# Patient Record
Sex: Male | Born: 1935 | Race: White | Hispanic: No | Marital: Married | Smoking: Never smoker
Health system: Southern US, Community
[De-identification: ages and names within clinical notes are randomized; demographics above are authoritative.]

## PROBLEM LIST (undated history)

## (undated) DIAGNOSIS — I6523 Occlusion and stenosis of bilateral carotid arteries: Secondary | ICD-10-CM

## (undated) DIAGNOSIS — Z9229 Personal history of other drug therapy: Secondary | ICD-10-CM

## (undated) DIAGNOSIS — E785 Hyperlipidemia, unspecified: Secondary | ICD-10-CM

## (undated) DIAGNOSIS — I2510 Atherosclerotic heart disease of native coronary artery without angina pectoris: Secondary | ICD-10-CM

## (undated) DIAGNOSIS — I10 Essential (primary) hypertension: Secondary | ICD-10-CM

## (undated) DIAGNOSIS — I480 Paroxysmal atrial fibrillation: Secondary | ICD-10-CM

## (undated) DIAGNOSIS — E119 Type 2 diabetes mellitus without complications: Secondary | ICD-10-CM

## (undated) HISTORY — DX: Personal history of other drug therapy: Z92.29

## (undated) HISTORY — DX: Type 2 diabetes mellitus without complications: E11.9

## (undated) HISTORY — DX: Essential (primary) hypertension: I10

## (undated) HISTORY — DX: Hyperlipidemia, unspecified: E78.5

## (undated) HISTORY — PX: TOTAL KNEE ARTHROPLASTY: SHX125

## (undated) HISTORY — DX: Paroxysmal atrial fibrillation: I48.0

## (undated) HISTORY — DX: Occlusion and stenosis of bilateral carotid arteries: I65.23

## (undated) HISTORY — DX: Atherosclerotic heart disease of native coronary artery without angina pectoris: I25.10

## (undated) HISTORY — PX: CORONARY ARTERY BYPASS GRAFT: SHX141

## (undated) HISTORY — PX: CARDIAC CATHETERIZATION: SHX172

---

## 2008-02-22 ENCOUNTER — Inpatient Hospital Stay (HOSPITAL_COMMUNITY): Admission: RE | Admit: 2008-02-22 | Discharge: 2008-02-26 | Payer: Self-pay | Admitting: Orthopedic Surgery

## 2008-08-24 ENCOUNTER — Inpatient Hospital Stay (HOSPITAL_COMMUNITY): Admission: RE | Admit: 2008-08-24 | Discharge: 2008-08-27 | Payer: Self-pay | Admitting: Orthopedic Surgery

## 2008-09-14 IMAGING — CR DG CHEST 1V PORT
1 series · 1 of 1 positions shown · non-contrast
Comparison: 02/14/2008

CLINICAL DATA: Postop knee replacement.  Decreased oxygen
saturation.

PORTABLE CHEST - 1 VIEW

[view not recorded]
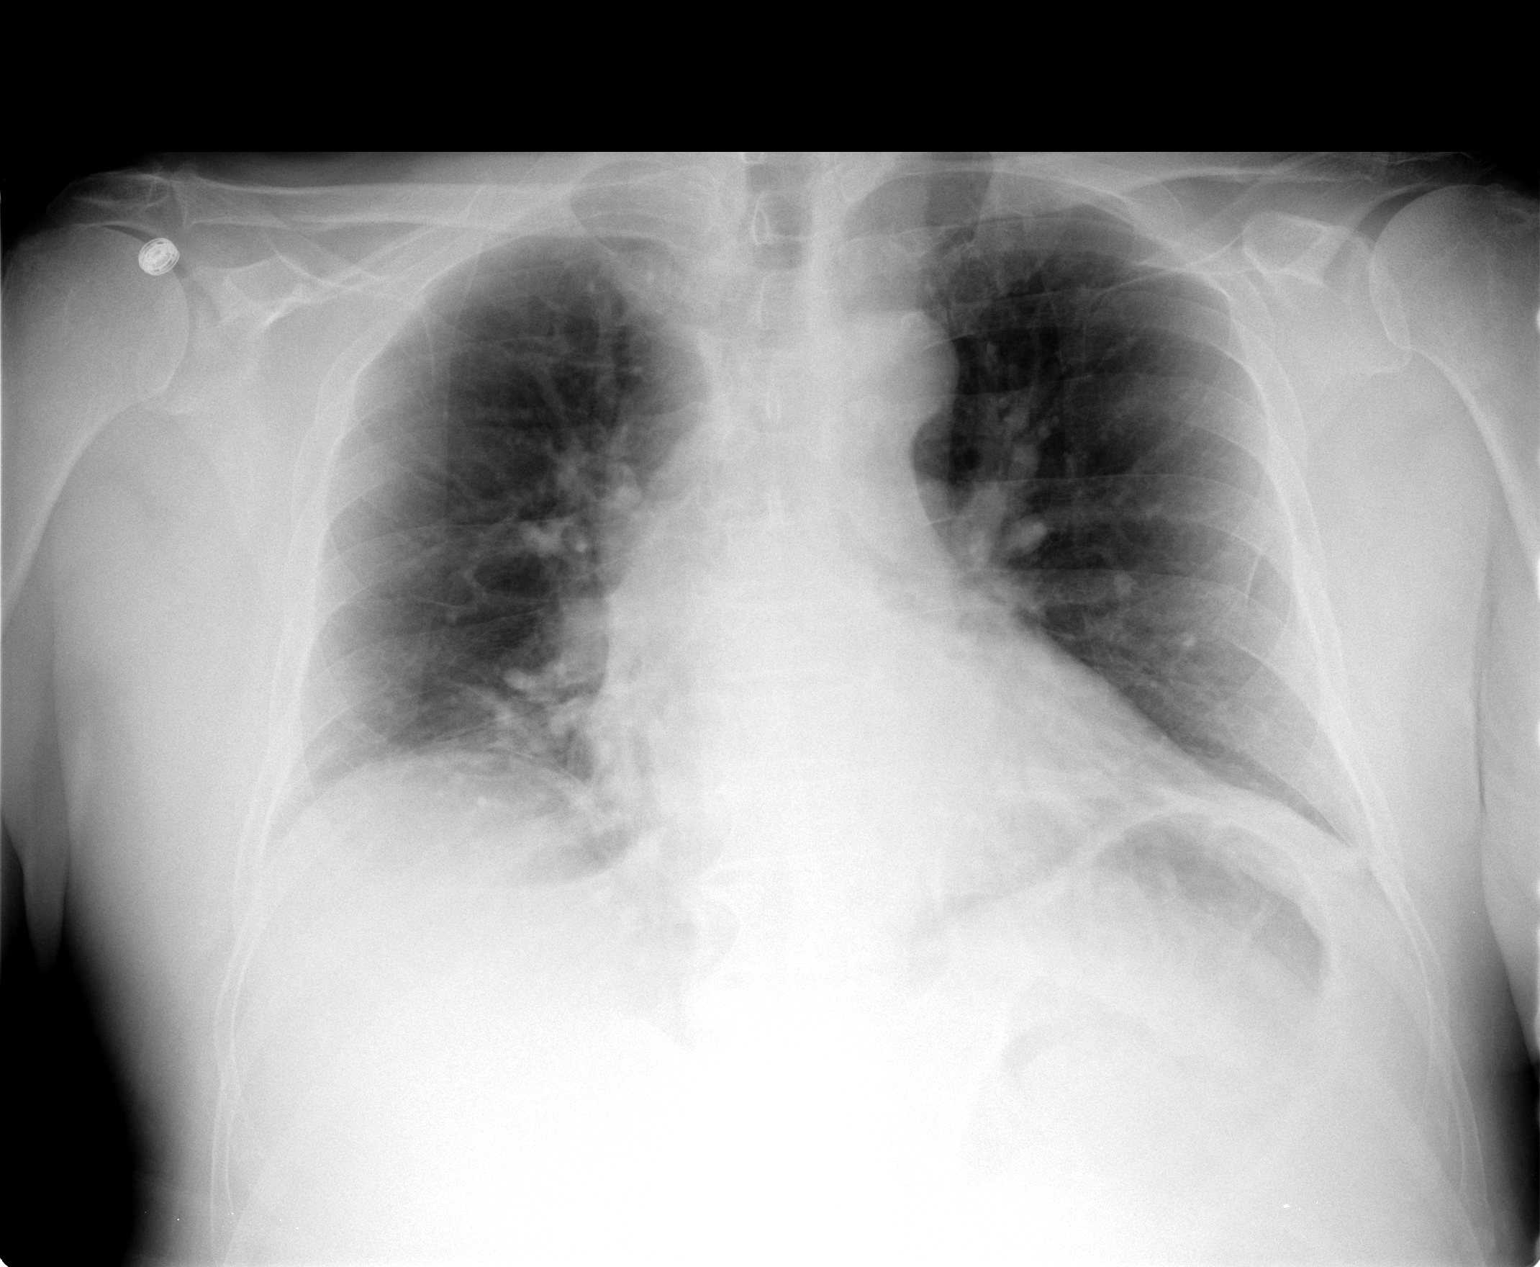

[1 of 1 positions shown; findings below may reference images not displayed]

FINDINGS: There is decreased lung volume with mild bibasilar
atelectasis.  There is no edema or effusion.
IMPRESSION: Hypoventilation with bibasilar atelectasis.

## 2011-02-24 NOTE — Op Note (Signed)
NAMEDENYS, SALINGER               ACCOUNT NO.:  [id number]   MEDICAL RECORD NO.:  [id number]          PATIENT TYPE:  INP   LOCATION:  5038                         FACILITY:  [facility]   PHYSICIAN:  Almedia Balls. Ranell Patrick, M.D. DATE OF BIRTH:  Jan 01, 1935   DATE OF PROCEDURE:  08/24/2008  DATE OF DISCHARGE:                               OPERATIVE REPORT   PREOPERATIVE DIAGNOSIS:  Left knee end-stage osteoarthritis.   POSTOPERATIVE DIAGNOSIS:  Left knee end-stage osteoarthritis.   PROCEDURE PERFORMED:  Left knee total knee replacement using DePuy Sigma  rotating-platform prosthesis.   ATTENDING SURGEON:  Almedia Balls. Ranell Patrick, MD   ASSISTANT:  Donnie Coffin. [place], [facility]   General anesthesia was used plus femoral block.   ESTIMATED BLOOD LOSS:  Minimal.   FLUID REPLACEMENT:  1200 mL of crystalloid.   INSTRUMENT COUNTS:  Correct.   COMPLICATIONS:  None.   Preoperative antibiotics were given.   INDICATIONS:  The patient is a 76 year old male status post right total  knee replacement for end-stage osteoarthritis.  Having done well with  that side, the patient's desire is to proceed with a left total knee  replacement for end-stage arthritis with functional loss and pain.  Informed consent was obtained.   DESCRIPTION OF PROCEDURE:  After an adequate level of anesthesia was  achieved, the patient was positioned in supine on the operating room  table.  The left leg was correctly identified.  Nonsterile tourniquet  placed on the left proximal thigh.  The left leg was sterilely prepped  and draped in the usual manner.  The leg was elevated and exsanguinated  using an Esmarch bandage, and then tourniquet elevated to 300 mmHg.  A  longitudinal midline incision was created with knee in flexion.  Medial  parapatellar arthrotomy was performed.  The patella was everted.  Distal  femur was opened using a step-cut drill.  Distal femoral resection of 10  mm using 5 degrees left.  We then sized the femur to a  size 3, matching  the other side, performed anterior and posterior chamfer cuts using the  4-in-1 block.  We then removed ACL and PCL, medial and lateral menisci,  and subluxed the tibia anteriorly.  Next, we went ahead and used the  extramedullary guide for the tibia and resected 4 mm off the affected  medial side, 90 degrees perpendicular long axis of tibia.  We then used  the extension and flexion block shims, and placed this into the knee to  make sure that we had adequate extension and flexion spaces.  Once that  was confirmed, we went ahead and finished preparation of the tibia,  sizing to size 3, performing our modular  keel punch and drill and then  placing the trial tibia in place.  We then resected the bone for the  posterior cruciate-substituting prosthesis using the box cut guide.  Once that was done, we placed a size 3 left femur in place with a 15  insert.  We are happy with the balance and range of motion.  At this  point, we went ahead and resurfaced the patella  going from a 26-mm  thickness down to a 16-mm thickness and replacing with size 35 patella.  This was done using the patellar clamp resection guide.  Once we  finished patella resurfacing, we went ahead and took the knee through a  full range of motion.  Excellent soft tissue balance and patella  tracking were achieved with no-touch technique.  We then removed all  trial components, thoroughly pulse irrigated the knee, and then cemented  the components in place.  We did bone plug into the femur using  available bone.  At this point, we went ahead and allowed the cement to  harden.  We had a 15 insert in place.  We felt like he can probably  tolerate a 17.5, which we did and this did fit a little bit better.  We  removed all excess cement using quarter inch curved osteotome.  We then  went ahead and closed the knee once we exchanged and placed the 17.5  real inserted in.  We went ahead and closed the knee using #1  Vicryl in  figure-of-eight for the medial parapatellar arthrotomy, we then used 0  and 2-0 Vicryl layer closure for subcutaneous closure, and 4-0 running  Monocryl for skin.  Steri-strips applied, followed by sterile dressing.  The patient tolerated the surgery well.      Almedia Balls. Ranell Patrick, M.D.  Electronically Signed     SRN/MEDQ  D:  08/24/2008  T:  08/25/2008  Job:  [id number]

## 2011-02-24 NOTE — H&P (Signed)
NAME:  Austin Duncan, Austin Duncan               ACCOUNT NO.:  [id number]   MEDICAL RECORD NO.:  [id number]          PATIENT TYPE:  INP   LOCATION:  NA                           FACILITY:  [facility]   PHYSICIAN:  Almedia Balls. Ranell Patrick, M.D. DATE OF BIRTH:  18-Feb-1935   DATE OF ADMISSION:  02/22/2008  DATE OF DISCHARGE:                              HISTORY & PHYSICAL   CHIEF COMPLAINT:  Right knee pain.   HISTORY OF PRESENT ILLNESS:  The patient is a 76 year old male with  worsening right knee pain that has been refractory with conservative  treatment.  The patient elected to have a right total knee arthroplasty  by Dr. Malon Kindle.   PAST MEDICAL HISTORY:  Significant for hypertension, GERD, and  osteoarthritis.   FAMILY MEDICAL HISTORY:  Coronary artery disease.   SOCIAL HISTORY:  Does not smoke or use alcohol.  Patient of Dr. Harrison Mons.   ALLERGIES:  No known drug allergies.   CURRENT MEDICATIONS:  The patient forgot his list so will have to check  those when he comes to the hospital, but he states he did take an  antihypertensive and some pain medicine.   REVIEW OF SYSTEMS:  Pain with ambulation.   PHYSICAL EXAMINATION:  VITAL SIGNS:  Pulse 78, respirations 16, blood  pressure 140/70.  GENERAL:  The patient is a healthy-appearing 76 year old male in no  acute distress.  Pleasant affect, alert and oriented  x3.  HEAD AND NECK:  Shows cranial nerves II-XII grossly intact.  He has full  range of motion of cervical, thoracic, and lumbar spines without any  difficulty and no tenderness.  CHEST:  Active breath sounds bilaterally.  No wheezes, rhonchi or rales.  HEART:  Shows regular rate and rhythm.  No murmur.  ABDOMEN:  Nontender, nondistended.  EXTREMITIES:  He has moderate tenderness to the right knee especially  medial with trace effusion.  He does have a mildly antalgic gait.  Calves nontender.  No pedal edema.  NEUROLOGIC:  He is intact distally.   X-rays show end-stage osteoarthritis to  the right knee.   IMPRESSION:  End-stage osteoarthritis right knee.   PLAN:  Total right total knee arthroplasty by Dr. Malon Kindle.      Thomas B. Durwin Nora, P.A.      Almedia Balls. Ranell Patrick, M.D.  Electronically Signed    TBD/MEDQ  D:  02/15/2008  T:  02/15/2008  Job:  [id number]

## 2011-02-24 NOTE — Op Note (Signed)
NAMEELMOR, KOST NO.:  [id number]   MEDICAL RECORD NO.:  [id number]          PATIENT TYPE:  INP   LOCATION:  0007                         FACILITY:  [facility]   PHYSICIAN:  Almedia Balls. Ranell Patrick, M.D. DATE OF BIRTH:  02-09-1935   DATE OF PROCEDURE:  02/22/2008  DATE OF DISCHARGE:                               OPERATIVE REPORT   PREOPERATIVE DIAGNOSIS:  Right knee end-stage arthritis.   POSTOPERATIVE DIAGNOSIS:  Right knee end-stage arthritis.   PROCEDURE PERFORMED:  Right total knee placement.   SURGEON:  Almedia Balls. Ranell Patrick, M.D.   ASSISTANT:  [place] Charon, [place].   ANESTHESIA:  General anesthesia plus femoral block anesthesia was used.   ESTIMATED BLOOD LOSS:  Minimal.   TOURNIQUET TIME:  1 hour and 10 minutes at 300 mmHg.   INSTRUMENT COUNTS:  Correct.   COMPLICATIONS:  None.   MEDICATIONS:  Perioperative antibiotics were given.   INDICATIONS:  The patient is a 76 year old male with worsening right  knee arthritis.  The patient has failed an extensive period of  conservative management and desires operative treatment to restore  function and eliminate pain.  Informed consent was obtained.   DESCRIPTION OF PROCEDURE:  After an adequate level of anesthesia was  achieved, the patient was positioned supine on the operating table.  Nonsterile tourniquet was placed on the right proximal thigh.  Right leg  was sterilely and draped in the usual manner.  After exsanguination of  the limb using an Esmarch bandage, we elevated the tourniquet to 300  mmHg.  A longitudinal midline incision was created with the knee in  flexion.  Medial parapatellar arthrotomy was created using a fresh  knife.  The patella was everted.  Lateral patellofemoral ligaments  divided.  The distal femur was entered using the cut drill.  We then  irrigated the distal femur and instrumented the distal femur with the  distal femoral resection guide intramedullary 5 degrees right, 10 mm  resection.  We then sized the femur to size 3, sized perfectly.  We then  performed anterior, posterior and chamfer block cuts.  Removed PCL, ACL  and remaining meniscal tissue.  Subluxed the tibia anteriorly and cut  the tibia 4 mm off the affected side, 90 degrees perpendicular long axis  of the tibia with minimal posterior slope.  We next went ahead and  checked our flexion/extension gaps.  Our gaps were symmetric.  At this  point we went and removed pins from the tibia.  We then continued  resurfacing the distal femur performing our box cut and removing excess  posterior bone off the posterior femoral condyles.  We then sized the  tibia to size 3, performed our modular drill and keel punch preparation  of the tibia.  We then trialed with the 15 and the 7.5 trial inserts.  The 17.5 gave [place] excellent stability and full extension.  We selected  that.  Then we went ahead and resurfaced the patella going from a 25  down to a 15 mm thickness, placing 38 patella button on.  Took the knee  through a  full range of motion.  Excellent soft tissue balancing, normal  patellar tracking noted.  We removed all trial components.  We plugged  the distal femur with available bone.  We then cemented the components  into place after thorough irrigation using DePuy Smart set cement.  Once  the cement was fully hardened having placed a tibial insert in and  holding the knee in extension after the cement was fully hardened we  removed excess cement with 4 inch curved osteotome.  We inspected the  remainder of the knee including posteriorly for any excess cement.  We  trialed again with the 17.5 insert and were happy with that.  We  selected the real 17.5 poly insert, inserting that, ranging the knee and  noting good stability.  We then closed the knee with interrupted #1 PDS  suture in figure-of-eight fashion followed by 0 and 2-0 Vicryl  subcutaneous closure and 4-0 Monocryl for skin.  Steri-Strips applied   followed by sterile dressing.  The patient tolerated the surgery well.      Almedia Balls. Ranell Patrick, M.D.  Electronically Signed     SRN/MEDQ  D:  02/22/2008  T:  02/22/2008  Job:  [id number]

## 2011-02-27 NOTE — H&P (Signed)
NAMESILUS, LANZO               ACCOUNT NO.:  [id number]   MEDICAL RECORD NO.:  [id number]          PATIENT TYPE:  INP   LOCATION:  NA                           FACILITY:  [facility]   PHYSICIAN:  Almedia Balls. Ranell Patrick, M.D. DATE OF BIRTH:  1934/11/28   DATE OF ADMISSION:  DATE OF DISCHARGE:                              HISTORY & PHYSICAL   CHIEF COMPLAINTS:  Left knee pain.   HISTORY OF PRESENT ILLNESS:  The patient is a 76 year old male with  worsening left knee pain secondary to osteoarthritis.  This has been  refractory to conservative treatment.  The patient elected to have a  left total knee arthroplasty by Dr. Malon Kindle.   PAST MEDICAL HISTORY:  1. Hypertension.  2. GERD.  3. Rheumatoid arthritis.   FAMILY HISTORY:  History of coronary artery disease and cancer.   SOCIAL HISTORY:  The patient is married.  The patient of Dr. Harrison Mons.  Does not smoke but use alcohol.   DRUG ALLERGIES:  None.   CURRENT MEDICATIONS:  1. Lisinopril 20 mg p.o. daily.  2. Omeprazole 20 mg p.o. daily.   REVIEW OF SYSTEMS:  Pain with ambulation only.   PHYSICAL EXAMINATION:  Pulse 80, respirations 61, and blood pressure  123/78.  The patient is a healthy-appearing 76 year old male in no acute  distress, pleasant mood and affect, alert and oriented x3.  Head and  neck exam shows cranial nerves II through XII grossly intact.  He has  full range of motion without any tenderness.  Chest has active breath  sounds bilaterally.  No wheezes, rhonchi, or rales.  Heart shows regular  rate and rhythm with a 2/6 systolic murmur.  Abdomen is nontender and  nondistended.  Extremities shows moderate tenderness at the left knee,  especially at the medial joint line.  Skin showed no rashes or edema.  X-  rays showed end-stage osteoarthritis, left knee.   IMPRESSION:  End-stage osteoarthritis, left knee.   PLAN OF ACTION:  He is to have a left total knee arthroplasty by Dr.  Malon Kindle.      Thomas B.  Durwin Nora, P.A.      Almedia Balls. Ranell Patrick, M.D.  Electronically Signed    TBD/MEDQ  D:  08/08/2008  T:  08/09/2008  Job:  [id number]

## 2011-02-27 NOTE — Discharge Summary (Signed)
Austin Duncan, Austin Duncan               ACCOUNT NO.:  [id number]   MEDICAL RECORD NO.:  [id number]          PATIENT TYPE:  INP   LOCATION:  5038                         FACILITY:  [facility]   PHYSICIAN:  Almedia Balls. Ranell Patrick, M.D. DATE OF BIRTH:  [birth year]   DATE OF ADMISSION:  08/24/2008  DATE OF DISCHARGE:  08/27/2008                               DISCHARGE SUMMARY   ADMITTING DIAGNOSIS:  Left knee arthritis, end-stage.   DISCHARGE DIAGNOSIS:  Left knee arthritis, end-stage.   PROCEDURE PERFORMED:  Left total knee replacement performed on August 24, 2008, by Dr. Ranell Patrick.   CONSULTING SERVICES:  Physical therapy, occupational therapy, discharge  planning, and pharmacy.   HISTORY OF PRESENT ILLNESS:  The patient is a 76 year old male with  disabling left knee pain.  The patient has failed conservative  management and desires operative treatment to restore function and  eliminate pain.  For further details on the patient's past medical  history and physical examination, please see the medical record.   HOSPITAL COURSE:  The patient was admitted to orthopedics August 24, 2008, where he underwent successful total knee arthroplasty.  He was  immediately started on DVT prophylaxis including mechanical devices  including TED hose and PlexiPulse foot pumps as well as Coumadin.  The  patient was up on physical therapy day 1.  CPM was instituted.  The  patient remained afebrile, tolerating regular diet, and stable  postoperative course.  He was discharged on August 27, 2008, to home  with home health physical therapy, occupational therapy, and nursing.  He will be on Coumadin for 30 days.  INR 2.5-3.0 per pharmacy protocol.  Discharged on Percocet and also on Robaxin.  We will be seeing him in  the office in 2 weeks and he is weightbearing as tolerated.       Almedia Balls. Ranell Patrick, M.D.  Electronically Signed     SRN/MEDQ  D:  10/10/2008  T:  10/11/2008  Job:  [id number]

## 2011-02-27 NOTE — Discharge Summary (Signed)
Austin Duncan, KUCHER               ACCOUNT NO.:  [id number]   MEDICAL RECORD NO.:  [id number]          PATIENT TYPE:  INP   LOCATION:  1610                         [facility]ACILITY:  [facility]   PHYSICIAN:  Almedia Balls. Ranell Patrick, M.D. DATE OF BIRTH:  1935-08-21   DATE OF ADMISSION:  02/22/2008  DATE OF DISCHARGE:  02/26/2008                               DISCHARGE SUMMARY   ADMISSION DIAGNOSIS:  Right knee end-stage osteoarthritis.   DISCHARGE DIAGNOSIS:  Right knee end-stage osteoarthritis.   BRIEF HISTORY:  The patient is a 76 year old male with worsening right  knee pain that has been refractory to conservative treatment.  Patient  has elected to have total knee arthroplasty.   PROCEDURE:  The patient had a right total knee arthroplasty by Dr.  Malon Kindle on Feb 22, 2008.  Assistant was [employer], PA-C.  General anesthesia was used with a femoral block.  No complications and  minimal blood loss.   HOSPITAL COURSE:  The patient admitted on Feb 22, 2008, for the above-  stated procedure, which he tolerated well.  After adequate time in the  post anesthesia care unit, he was transferred up to [address].  Postop day  #1 the patient complained of some moderate pain to the knee but  otherwise was comfortable.  His dressing was dry and intact.  He was  able to work with physical therapy over the next several days and  actually did quite well, enough to be eligible for discharge home on the  17th.  His INR was slowly rising to 1.6 and 1.8 on the day of discharge.  Otherwise, his wound was healing well and the patient was afebrile.   DISCHARGE/PLAN:  The patient will be discharged home on Feb 26, 2008.   CONDITION:  Stable.   DIET:  Regular as tolerated.   The patient will follow back up with Dr. Malon Kindle in 2 weeks.   DISCHARGE MEDICATIONS:  1. Percocet 5/325 mg one to two tabs q.4-6 h. p.r.n. pain.  2. Robaxin 500 mg p.o. q.6 h.  3. Coumadin per pharmacy protocol.      Thomas B.  Durwin Nora, P.A.      Almedia Balls. Ranell Patrick, M.D.  Electronically Signed    TBD/MEDQ  D:  04/16/2008  T:  04/16/2008  Job:  [id number]

## 2011-07-08 LAB — BASIC METABOLIC PANEL
BUN: 6
BUN: 8
CO2: 26
Calcium: 8.4
Chloride: 106
GFR calc Af Amer: >60
GFR calc non Af Amer: >60
GFR calc non Af Amer: >60
Glucose, Bld: 157 — ABNORMAL HIGH
Potassium: 3.6
Sodium: 135

## 2011-07-08 LAB — PROTIME-INR
INR: 1.8 — ABNORMAL HIGH
Prothrombin Time: 18.3 — ABNORMAL HIGH
Prothrombin Time: 19.6 — ABNORMAL HIGH
Prothrombin Time: 21.1 — ABNORMAL HIGH

## 2011-07-08 LAB — CBC
HCT: 34.5 — ABNORMAL LOW
Hemoglobin: 11.8 — ABNORMAL LOW
Hemoglobin: 12.4 — ABNORMAL LOW
MCHC: 34.2
MCHC: 34.4
MCV: 87.7
Platelets: 118 — ABNORMAL LOW
Platelets: 119 — ABNORMAL LOW
RBC: 3.93 — ABNORMAL LOW
RDW: 13.3
WBC: 6.9

## 2011-07-14 LAB — DIFFERENTIAL
Basophils Absolute: 0
Basophils Relative: 0
Lymphocytes Relative: 29
Monocytes Relative: 7
Neutrophils Relative %: 62

## 2011-07-14 LAB — CBC
Hemoglobin: 11.8 — ABNORMAL LOW
Hemoglobin: 12.6 — ABNORMAL LOW
Hemoglobin: 14.8
MCHC: 33.7
MCHC: 34.2
MCHC: 34.4
MCHC: 34.4
MCV: 88.3
MCV: 89.3
MCV: 89.6
Platelets: 135 — ABNORMAL LOW
RBC: 3.63 — ABNORMAL LOW
RBC: 3.83 — ABNORMAL LOW
RDW: 13
RDW: 13.7
WBC: 5.3
WBC: 8.7

## 2011-07-14 LAB — PROTIME-INR
INR: 1.4
INR: 1.9 — ABNORMAL HIGH
Prothrombin Time: 13.7
Prothrombin Time: 15.2
Prothrombin Time: 22.5 — ABNORMAL HIGH

## 2011-07-14 LAB — BASIC METABOLIC PANEL
BUN: 11
CO2: 27
CO2: 27
CO2: 28
Calcium: 8.6
Calcium: 8.8
Chloride: 101
Chloride: 99
Creatinine, Ser: 0.81
Creatinine, Ser: 0.95
GFR calc Af Amer: >60
GFR calc Af Amer: >60
GFR calc Af Amer: >60
GFR calc Af Amer: >60
GFR calc non Af Amer: >60
Potassium: 3.8
Sodium: 134 — ABNORMAL LOW
Sodium: 135
Sodium: 138

## 2011-07-14 LAB — GLUCOSE, CAPILLARY
Glucose-Capillary: 116 — ABNORMAL HIGH
Glucose-Capillary: 133 — ABNORMAL HIGH
Glucose-Capillary: 151 — ABNORMAL HIGH
Glucose-Capillary: 167 — ABNORMAL HIGH
Glucose-Capillary: 196 — ABNORMAL HIGH
Glucose-Capillary: 196 — ABNORMAL HIGH
Glucose-Capillary: 203 — ABNORMAL HIGH
Glucose-Capillary: 208 — ABNORMAL HIGH

## 2011-07-14 LAB — ABO/RH: ABO/RH(D): A NEG

## 2011-07-14 LAB — TYPE AND SCREEN: Antibody Screen: NEGATIVE

## 2011-07-14 LAB — URINALYSIS, ROUTINE W REFLEX MICROSCOPIC
Bilirubin Urine: NEGATIVE
Nitrite: NEGATIVE
Urobilinogen, UA: 0.2
pH: 5.5

## 2014-11-29 DIAGNOSIS — E1142 Type 2 diabetes mellitus with diabetic polyneuropathy: Secondary | ICD-10-CM | POA: Diagnosis not present

## 2014-11-29 DIAGNOSIS — I10 Essential (primary) hypertension: Secondary | ICD-10-CM | POA: Diagnosis not present

## 2014-11-29 DIAGNOSIS — Z79899 Other long term (current) drug therapy: Secondary | ICD-10-CM | POA: Diagnosis not present

## 2014-11-29 DIAGNOSIS — I2510 Atherosclerotic heart disease of native coronary artery without angina pectoris: Secondary | ICD-10-CM | POA: Diagnosis not present

## 2014-11-29 DIAGNOSIS — E785 Hyperlipidemia, unspecified: Secondary | ICD-10-CM | POA: Diagnosis not present

## 2015-06-12 DIAGNOSIS — E785 Hyperlipidemia, unspecified: Secondary | ICD-10-CM | POA: Diagnosis not present

## 2015-06-12 DIAGNOSIS — Z79899 Other long term (current) drug therapy: Secondary | ICD-10-CM | POA: Diagnosis not present

## 2015-06-12 DIAGNOSIS — I2510 Atherosclerotic heart disease of native coronary artery without angina pectoris: Secondary | ICD-10-CM | POA: Diagnosis not present

## 2015-06-12 DIAGNOSIS — E1142 Type 2 diabetes mellitus with diabetic polyneuropathy: Secondary | ICD-10-CM | POA: Diagnosis not present

## 2015-06-12 DIAGNOSIS — E663 Overweight: Secondary | ICD-10-CM | POA: Diagnosis not present

## 2015-06-12 DIAGNOSIS — Z6829 Body mass index (BMI) 29.0-29.9, adult: Secondary | ICD-10-CM | POA: Diagnosis not present

## 2015-06-12 DIAGNOSIS — Z23 Encounter for immunization: Secondary | ICD-10-CM | POA: Diagnosis not present

## 2015-06-12 DIAGNOSIS — I10 Essential (primary) hypertension: Secondary | ICD-10-CM | POA: Diagnosis not present

## 2015-10-10 DIAGNOSIS — E119 Type 2 diabetes mellitus without complications: Secondary | ICD-10-CM | POA: Diagnosis not present

## 2015-11-11 DIAGNOSIS — I10 Essential (primary) hypertension: Secondary | ICD-10-CM | POA: Diagnosis not present

## 2015-11-11 DIAGNOSIS — Z6829 Body mass index (BMI) 29.0-29.9, adult: Secondary | ICD-10-CM | POA: Diagnosis not present

## 2015-11-12 DIAGNOSIS — I10 Essential (primary) hypertension: Secondary | ICD-10-CM | POA: Diagnosis not present

## 2016-02-17 DIAGNOSIS — E785 Hyperlipidemia, unspecified: Secondary | ICD-10-CM | POA: Diagnosis not present

## 2016-02-17 DIAGNOSIS — Z9181 History of falling: Secondary | ICD-10-CM | POA: Diagnosis not present

## 2016-02-17 DIAGNOSIS — E119 Type 2 diabetes mellitus without complications: Secondary | ICD-10-CM | POA: Diagnosis not present

## 2016-02-17 DIAGNOSIS — M1990 Unspecified osteoarthritis, unspecified site: Secondary | ICD-10-CM | POA: Diagnosis not present

## 2016-02-17 DIAGNOSIS — Z6828 Body mass index (BMI) 28.0-28.9, adult: Secondary | ICD-10-CM | POA: Diagnosis not present

## 2016-02-17 DIAGNOSIS — Z1389 Encounter for screening for other disorder: Secondary | ICD-10-CM | POA: Diagnosis not present

## 2016-02-17 DIAGNOSIS — I10 Essential (primary) hypertension: Secondary | ICD-10-CM | POA: Diagnosis not present

## 2016-02-17 DIAGNOSIS — K219 Gastro-esophageal reflux disease without esophagitis: Secondary | ICD-10-CM | POA: Diagnosis not present

## 2016-06-13 DIAGNOSIS — Z23 Encounter for immunization: Secondary | ICD-10-CM | POA: Diagnosis not present

## 2016-08-25 DIAGNOSIS — K219 Gastro-esophageal reflux disease without esophagitis: Secondary | ICD-10-CM | POA: Diagnosis not present

## 2016-08-25 DIAGNOSIS — E785 Hyperlipidemia, unspecified: Secondary | ICD-10-CM | POA: Diagnosis not present

## 2016-08-25 DIAGNOSIS — E119 Type 2 diabetes mellitus without complications: Secondary | ICD-10-CM | POA: Diagnosis not present

## 2016-08-25 DIAGNOSIS — Z6828 Body mass index (BMI) 28.0-28.9, adult: Secondary | ICD-10-CM | POA: Diagnosis not present

## 2016-08-25 DIAGNOSIS — I10 Essential (primary) hypertension: Secondary | ICD-10-CM | POA: Diagnosis not present

## 2016-10-23 DIAGNOSIS — Z961 Presence of intraocular lens: Secondary | ICD-10-CM | POA: Diagnosis not present

## 2016-10-23 DIAGNOSIS — H1851 Endothelial corneal dystrophy: Secondary | ICD-10-CM | POA: Diagnosis not present

## 2016-10-23 DIAGNOSIS — E119 Type 2 diabetes mellitus without complications: Secondary | ICD-10-CM | POA: Diagnosis not present

## 2016-11-12 DIAGNOSIS — J111 Influenza due to unidentified influenza virus with other respiratory manifestations: Secondary | ICD-10-CM | POA: Diagnosis not present

## 2016-11-12 DIAGNOSIS — R6889 Other general symptoms and signs: Secondary | ICD-10-CM | POA: Diagnosis not present

## 2016-11-12 DIAGNOSIS — Z6829 Body mass index (BMI) 29.0-29.9, adult: Secondary | ICD-10-CM | POA: Diagnosis not present

## 2016-12-06 DIAGNOSIS — N433 Hydrocele, unspecified: Secondary | ICD-10-CM | POA: Diagnosis not present

## 2016-12-06 DIAGNOSIS — N390 Urinary tract infection, site not specified: Secondary | ICD-10-CM | POA: Diagnosis not present

## 2016-12-15 DIAGNOSIS — N5082 Scrotal pain: Secondary | ICD-10-CM | POA: Diagnosis not present

## 2016-12-15 DIAGNOSIS — N433 Hydrocele, unspecified: Secondary | ICD-10-CM | POA: Diagnosis not present

## 2017-02-22 DIAGNOSIS — Z125 Encounter for screening for malignant neoplasm of prostate: Secondary | ICD-10-CM | POA: Diagnosis not present

## 2017-02-22 DIAGNOSIS — M1990 Unspecified osteoarthritis, unspecified site: Secondary | ICD-10-CM | POA: Diagnosis not present

## 2017-02-22 DIAGNOSIS — I10 Essential (primary) hypertension: Secondary | ICD-10-CM | POA: Diagnosis not present

## 2017-02-22 DIAGNOSIS — E119 Type 2 diabetes mellitus without complications: Secondary | ICD-10-CM | POA: Diagnosis not present

## 2017-02-22 DIAGNOSIS — K219 Gastro-esophageal reflux disease without esophagitis: Secondary | ICD-10-CM | POA: Diagnosis not present

## 2017-02-22 DIAGNOSIS — E785 Hyperlipidemia, unspecified: Secondary | ICD-10-CM | POA: Diagnosis not present

## 2017-03-02 DIAGNOSIS — Z1212 Encounter for screening for malignant neoplasm of rectum: Secondary | ICD-10-CM | POA: Diagnosis not present

## 2017-06-30 DIAGNOSIS — Z23 Encounter for immunization: Secondary | ICD-10-CM | POA: Diagnosis not present

## 2017-07-05 DIAGNOSIS — L814 Other melanin hyperpigmentation: Secondary | ICD-10-CM | POA: Diagnosis not present

## 2017-07-05 DIAGNOSIS — C44622 Squamous cell carcinoma of skin of right upper limb, including shoulder: Secondary | ICD-10-CM | POA: Diagnosis not present

## 2017-07-05 DIAGNOSIS — L570 Actinic keratosis: Secondary | ICD-10-CM | POA: Diagnosis not present

## 2017-07-05 DIAGNOSIS — C44629 Squamous cell carcinoma of skin of left upper limb, including shoulder: Secondary | ICD-10-CM | POA: Diagnosis not present

## 2017-08-10 DIAGNOSIS — C44629 Squamous cell carcinoma of skin of left upper limb, including shoulder: Secondary | ICD-10-CM | POA: Diagnosis not present

## 2017-08-17 DIAGNOSIS — C44622 Squamous cell carcinoma of skin of right upper limb, including shoulder: Secondary | ICD-10-CM | POA: Diagnosis not present

## 2017-08-30 DIAGNOSIS — Z6828 Body mass index (BMI) 28.0-28.9, adult: Secondary | ICD-10-CM | POA: Diagnosis not present

## 2017-08-30 DIAGNOSIS — I10 Essential (primary) hypertension: Secondary | ICD-10-CM | POA: Diagnosis not present

## 2017-08-30 DIAGNOSIS — E785 Hyperlipidemia, unspecified: Secondary | ICD-10-CM | POA: Diagnosis not present

## 2017-08-30 DIAGNOSIS — K219 Gastro-esophageal reflux disease without esophagitis: Secondary | ICD-10-CM | POA: Diagnosis not present

## 2017-08-30 DIAGNOSIS — E119 Type 2 diabetes mellitus without complications: Secondary | ICD-10-CM | POA: Diagnosis not present

## 2017-08-30 DIAGNOSIS — R0609 Other forms of dyspnea: Secondary | ICD-10-CM | POA: Diagnosis not present

## 2017-08-31 DIAGNOSIS — R0609 Other forms of dyspnea: Secondary | ICD-10-CM | POA: Diagnosis not present

## 2017-08-31 DIAGNOSIS — R0602 Shortness of breath: Secondary | ICD-10-CM | POA: Diagnosis not present

## 2017-09-08 DIAGNOSIS — R079 Chest pain, unspecified: Secondary | ICD-10-CM | POA: Diagnosis not present

## 2017-09-08 DIAGNOSIS — R0602 Shortness of breath: Secondary | ICD-10-CM | POA: Insufficient documentation

## 2017-09-08 DIAGNOSIS — I10 Essential (primary) hypertension: Secondary | ICD-10-CM | POA: Diagnosis not present

## 2017-09-08 HISTORY — DX: Shortness of breath: R06.02

## 2017-09-08 HISTORY — DX: Chest pain, unspecified: R07.9

## 2017-09-15 DIAGNOSIS — I700 Atherosclerosis of aorta: Secondary | ICD-10-CM | POA: Diagnosis not present

## 2017-09-15 DIAGNOSIS — R918 Other nonspecific abnormal finding of lung field: Secondary | ICD-10-CM | POA: Diagnosis not present

## 2017-09-16 DIAGNOSIS — I25708 Atherosclerosis of coronary artery bypass graft(s), unspecified, with other forms of angina pectoris: Secondary | ICD-10-CM | POA: Diagnosis not present

## 2017-09-16 DIAGNOSIS — Z794 Long term (current) use of insulin: Secondary | ICD-10-CM | POA: Diagnosis not present

## 2017-09-16 DIAGNOSIS — D649 Anemia, unspecified: Secondary | ICD-10-CM | POA: Diagnosis not present

## 2017-09-16 DIAGNOSIS — I472 Ventricular tachycardia: Secondary | ICD-10-CM | POA: Diagnosis not present

## 2017-09-16 DIAGNOSIS — T8131XA Disruption of external operation (surgical) wound, not elsewhere classified, initial encounter: Secondary | ICD-10-CM | POA: Diagnosis not present

## 2017-09-16 DIAGNOSIS — I25119 Atherosclerotic heart disease of native coronary artery with unspecified angina pectoris: Secondary | ICD-10-CM | POA: Diagnosis not present

## 2017-09-16 DIAGNOSIS — Z7982 Long term (current) use of aspirin: Secondary | ICD-10-CM | POA: Diagnosis not present

## 2017-09-16 DIAGNOSIS — E7849 Other hyperlipidemia: Secondary | ICD-10-CM | POA: Diagnosis not present

## 2017-09-16 DIAGNOSIS — I700 Atherosclerosis of aorta: Secondary | ICD-10-CM | POA: Diagnosis not present

## 2017-09-16 DIAGNOSIS — I480 Paroxysmal atrial fibrillation: Secondary | ICD-10-CM | POA: Diagnosis not present

## 2017-09-16 DIAGNOSIS — J90 Pleural effusion, not elsewhere classified: Secondary | ICD-10-CM | POA: Diagnosis not present

## 2017-09-16 DIAGNOSIS — I2510 Atherosclerotic heart disease of native coronary artery without angina pectoris: Secondary | ICD-10-CM | POA: Diagnosis not present

## 2017-09-16 DIAGNOSIS — R918 Other nonspecific abnormal finding of lung field: Secondary | ICD-10-CM | POA: Diagnosis not present

## 2017-09-16 DIAGNOSIS — I25810 Atherosclerosis of coronary artery bypass graft(s) without angina pectoris: Secondary | ICD-10-CM | POA: Diagnosis not present

## 2017-09-16 DIAGNOSIS — I97190 Other postprocedural cardiac functional disturbances following cardiac surgery: Secondary | ICD-10-CM | POA: Diagnosis not present

## 2017-09-16 DIAGNOSIS — E119 Type 2 diabetes mellitus without complications: Secondary | ICD-10-CM | POA: Diagnosis not present

## 2017-09-16 DIAGNOSIS — R5381 Other malaise: Secondary | ICD-10-CM | POA: Diagnosis not present

## 2017-09-16 DIAGNOSIS — Z951 Presence of aortocoronary bypass graft: Secondary | ICD-10-CM | POA: Diagnosis not present

## 2017-09-16 DIAGNOSIS — I10 Essential (primary) hypertension: Secondary | ICD-10-CM | POA: Diagnosis not present

## 2017-09-16 DIAGNOSIS — D62 Acute posthemorrhagic anemia: Secondary | ICD-10-CM | POA: Diagnosis not present

## 2017-09-16 DIAGNOSIS — E785 Hyperlipidemia, unspecified: Secondary | ICD-10-CM | POA: Diagnosis not present

## 2017-09-16 DIAGNOSIS — R0989 Other specified symptoms and signs involving the circulatory and respiratory systems: Secondary | ICD-10-CM | POA: Diagnosis not present

## 2017-09-16 DIAGNOSIS — Z79899 Other long term (current) drug therapy: Secondary | ICD-10-CM | POA: Diagnosis not present

## 2017-09-24 DIAGNOSIS — R845 Abnormal microbiological findings in specimens from respiratory organs and thorax: Secondary | ICD-10-CM | POA: Diagnosis not present

## 2017-09-24 DIAGNOSIS — I97190 Other postprocedural cardiac functional disturbances following cardiac surgery: Secondary | ICD-10-CM | POA: Diagnosis not present

## 2017-09-24 DIAGNOSIS — R197 Diarrhea, unspecified: Secondary | ICD-10-CM | POA: Diagnosis not present

## 2017-09-24 DIAGNOSIS — Z79899 Other long term (current) drug therapy: Secondary | ICD-10-CM | POA: Diagnosis not present

## 2017-09-24 DIAGNOSIS — Z7409 Other reduced mobility: Secondary | ICD-10-CM | POA: Diagnosis present

## 2017-09-24 DIAGNOSIS — Z951 Presence of aortocoronary bypass graft: Secondary | ICD-10-CM | POA: Diagnosis not present

## 2017-09-24 DIAGNOSIS — R21 Rash and other nonspecific skin eruption: Secondary | ICD-10-CM | POA: Diagnosis not present

## 2017-09-24 DIAGNOSIS — J90 Pleural effusion, not elsewhere classified: Secondary | ICD-10-CM | POA: Diagnosis not present

## 2017-09-24 DIAGNOSIS — E119 Type 2 diabetes mellitus without complications: Secondary | ICD-10-CM | POA: Diagnosis not present

## 2017-09-24 DIAGNOSIS — I2510 Atherosclerotic heart disease of native coronary artery without angina pectoris: Secondary | ICD-10-CM | POA: Diagnosis present

## 2017-09-24 DIAGNOSIS — R5381 Other malaise: Secondary | ICD-10-CM | POA: Diagnosis not present

## 2017-09-24 DIAGNOSIS — R05 Cough: Secondary | ICD-10-CM | POA: Diagnosis not present

## 2017-09-24 DIAGNOSIS — R600 Localized edema: Secondary | ICD-10-CM | POA: Diagnosis present

## 2017-09-24 DIAGNOSIS — E785 Hyperlipidemia, unspecified: Secondary | ICD-10-CM | POA: Diagnosis present

## 2017-09-24 DIAGNOSIS — I472 Ventricular tachycardia: Secondary | ICD-10-CM | POA: Diagnosis present

## 2017-09-24 DIAGNOSIS — J189 Pneumonia, unspecified organism: Secondary | ICD-10-CM | POA: Diagnosis present

## 2017-09-24 DIAGNOSIS — I471 Supraventricular tachycardia: Secondary | ICD-10-CM | POA: Diagnosis not present

## 2017-09-24 DIAGNOSIS — I10 Essential (primary) hypertension: Secondary | ICD-10-CM | POA: Diagnosis not present

## 2017-09-24 DIAGNOSIS — Z48812 Encounter for surgical aftercare following surgery on the circulatory system: Secondary | ICD-10-CM | POA: Diagnosis not present

## 2017-09-24 DIAGNOSIS — I4891 Unspecified atrial fibrillation: Secondary | ICD-10-CM | POA: Diagnosis not present

## 2017-09-30 DIAGNOSIS — J90 Pleural effusion, not elsewhere classified: Secondary | ICD-10-CM | POA: Diagnosis not present

## 2017-10-13 DIAGNOSIS — Z09 Encounter for follow-up examination after completed treatment for conditions other than malignant neoplasm: Secondary | ICD-10-CM | POA: Diagnosis not present

## 2017-10-15 DIAGNOSIS — E782 Mixed hyperlipidemia: Secondary | ICD-10-CM | POA: Diagnosis not present

## 2017-10-15 DIAGNOSIS — Z951 Presence of aortocoronary bypass graft: Secondary | ICD-10-CM

## 2017-10-15 DIAGNOSIS — I480 Paroxysmal atrial fibrillation: Secondary | ICD-10-CM | POA: Diagnosis not present

## 2017-10-15 DIAGNOSIS — I10 Essential (primary) hypertension: Secondary | ICD-10-CM | POA: Diagnosis not present

## 2017-10-15 DIAGNOSIS — I2510 Atherosclerotic heart disease of native coronary artery without angina pectoris: Secondary | ICD-10-CM | POA: Diagnosis not present

## 2017-10-15 HISTORY — DX: Presence of aortocoronary bypass graft: Z95.1

## 2017-10-18 DIAGNOSIS — E785 Hyperlipidemia, unspecified: Secondary | ICD-10-CM | POA: Diagnosis not present

## 2017-11-01 DIAGNOSIS — E785 Hyperlipidemia, unspecified: Secondary | ICD-10-CM | POA: Diagnosis not present

## 2017-11-01 DIAGNOSIS — Z9181 History of falling: Secondary | ICD-10-CM | POA: Diagnosis not present

## 2017-11-01 DIAGNOSIS — Z1331 Encounter for screening for depression: Secondary | ICD-10-CM | POA: Diagnosis not present

## 2017-11-01 DIAGNOSIS — Z23 Encounter for immunization: Secondary | ICD-10-CM | POA: Diagnosis not present

## 2017-11-01 DIAGNOSIS — Z125 Encounter for screening for malignant neoplasm of prostate: Secondary | ICD-10-CM | POA: Diagnosis not present

## 2017-11-01 DIAGNOSIS — Z0000 Encounter for general adult medical examination without abnormal findings: Secondary | ICD-10-CM | POA: Diagnosis not present

## 2017-11-12 DIAGNOSIS — E782 Mixed hyperlipidemia: Secondary | ICD-10-CM | POA: Diagnosis not present

## 2017-11-12 DIAGNOSIS — I480 Paroxysmal atrial fibrillation: Secondary | ICD-10-CM | POA: Diagnosis not present

## 2017-11-12 DIAGNOSIS — Z951 Presence of aortocoronary bypass graft: Secondary | ICD-10-CM | POA: Diagnosis not present

## 2017-11-12 DIAGNOSIS — I2510 Atherosclerotic heart disease of native coronary artery without angina pectoris: Secondary | ICD-10-CM | POA: Diagnosis not present

## 2017-11-12 DIAGNOSIS — I10 Essential (primary) hypertension: Secondary | ICD-10-CM | POA: Diagnosis not present

## 2017-11-17 DIAGNOSIS — Z6827 Body mass index (BMI) 27.0-27.9, adult: Secondary | ICD-10-CM | POA: Diagnosis not present

## 2017-11-17 DIAGNOSIS — I10 Essential (primary) hypertension: Secondary | ICD-10-CM | POA: Diagnosis not present

## 2017-11-17 DIAGNOSIS — I2510 Atherosclerotic heart disease of native coronary artery without angina pectoris: Secondary | ICD-10-CM | POA: Diagnosis not present

## 2017-11-18 DIAGNOSIS — Z961 Presence of intraocular lens: Secondary | ICD-10-CM | POA: Diagnosis not present

## 2017-11-18 DIAGNOSIS — E103291 Type 1 diabetes mellitus with mild nonproliferative diabetic retinopathy without macular edema, right eye: Secondary | ICD-10-CM | POA: Diagnosis not present

## 2017-11-23 DIAGNOSIS — Z951 Presence of aortocoronary bypass graft: Secondary | ICD-10-CM | POA: Diagnosis not present

## 2017-11-23 DIAGNOSIS — I10 Essential (primary) hypertension: Secondary | ICD-10-CM | POA: Diagnosis not present

## 2017-11-23 DIAGNOSIS — E785 Hyperlipidemia, unspecified: Secondary | ICD-10-CM | POA: Diagnosis not present

## 2017-11-23 DIAGNOSIS — E119 Type 2 diabetes mellitus without complications: Secondary | ICD-10-CM | POA: Diagnosis not present

## 2017-11-24 DIAGNOSIS — I10 Essential (primary) hypertension: Secondary | ICD-10-CM | POA: Diagnosis not present

## 2017-11-24 DIAGNOSIS — Z951 Presence of aortocoronary bypass graft: Secondary | ICD-10-CM | POA: Diagnosis not present

## 2017-11-24 DIAGNOSIS — E119 Type 2 diabetes mellitus without complications: Secondary | ICD-10-CM | POA: Diagnosis not present

## 2017-11-24 DIAGNOSIS — E785 Hyperlipidemia, unspecified: Secondary | ICD-10-CM | POA: Diagnosis not present

## 2017-11-26 DIAGNOSIS — E785 Hyperlipidemia, unspecified: Secondary | ICD-10-CM | POA: Diagnosis not present

## 2017-11-26 DIAGNOSIS — Z951 Presence of aortocoronary bypass graft: Secondary | ICD-10-CM | POA: Diagnosis not present

## 2017-11-26 DIAGNOSIS — E119 Type 2 diabetes mellitus without complications: Secondary | ICD-10-CM | POA: Diagnosis not present

## 2017-11-26 DIAGNOSIS — I10 Essential (primary) hypertension: Secondary | ICD-10-CM | POA: Diagnosis not present

## 2017-11-29 DIAGNOSIS — E119 Type 2 diabetes mellitus without complications: Secondary | ICD-10-CM | POA: Diagnosis not present

## 2017-11-29 DIAGNOSIS — I10 Essential (primary) hypertension: Secondary | ICD-10-CM | POA: Diagnosis not present

## 2017-11-29 DIAGNOSIS — E785 Hyperlipidemia, unspecified: Secondary | ICD-10-CM | POA: Diagnosis not present

## 2017-11-29 DIAGNOSIS — Z951 Presence of aortocoronary bypass graft: Secondary | ICD-10-CM | POA: Diagnosis not present

## 2017-12-03 DIAGNOSIS — I10 Essential (primary) hypertension: Secondary | ICD-10-CM | POA: Diagnosis not present

## 2017-12-03 DIAGNOSIS — E119 Type 2 diabetes mellitus without complications: Secondary | ICD-10-CM | POA: Diagnosis not present

## 2017-12-03 DIAGNOSIS — Z951 Presence of aortocoronary bypass graft: Secondary | ICD-10-CM | POA: Diagnosis not present

## 2017-12-03 DIAGNOSIS — E785 Hyperlipidemia, unspecified: Secondary | ICD-10-CM | POA: Diagnosis not present

## 2017-12-06 DIAGNOSIS — E119 Type 2 diabetes mellitus without complications: Secondary | ICD-10-CM | POA: Diagnosis not present

## 2017-12-06 DIAGNOSIS — E785 Hyperlipidemia, unspecified: Secondary | ICD-10-CM | POA: Diagnosis not present

## 2017-12-06 DIAGNOSIS — Z951 Presence of aortocoronary bypass graft: Secondary | ICD-10-CM | POA: Diagnosis not present

## 2017-12-06 DIAGNOSIS — I10 Essential (primary) hypertension: Secondary | ICD-10-CM | POA: Diagnosis not present

## 2017-12-08 DIAGNOSIS — E119 Type 2 diabetes mellitus without complications: Secondary | ICD-10-CM | POA: Diagnosis not present

## 2017-12-08 DIAGNOSIS — I10 Essential (primary) hypertension: Secondary | ICD-10-CM | POA: Diagnosis not present

## 2017-12-08 DIAGNOSIS — Z951 Presence of aortocoronary bypass graft: Secondary | ICD-10-CM | POA: Diagnosis not present

## 2017-12-08 DIAGNOSIS — E785 Hyperlipidemia, unspecified: Secondary | ICD-10-CM | POA: Diagnosis not present

## 2017-12-10 DIAGNOSIS — Z951 Presence of aortocoronary bypass graft: Secondary | ICD-10-CM | POA: Diagnosis not present

## 2017-12-13 DIAGNOSIS — Z951 Presence of aortocoronary bypass graft: Secondary | ICD-10-CM | POA: Diagnosis not present

## 2017-12-17 DIAGNOSIS — Z951 Presence of aortocoronary bypass graft: Secondary | ICD-10-CM | POA: Diagnosis not present

## 2017-12-20 DIAGNOSIS — Z951 Presence of aortocoronary bypass graft: Secondary | ICD-10-CM | POA: Diagnosis not present

## 2017-12-22 DIAGNOSIS — Z951 Presence of aortocoronary bypass graft: Secondary | ICD-10-CM | POA: Diagnosis not present

## 2017-12-24 DIAGNOSIS — Z951 Presence of aortocoronary bypass graft: Secondary | ICD-10-CM | POA: Diagnosis not present

## 2018-02-11 DIAGNOSIS — Z951 Presence of aortocoronary bypass graft: Secondary | ICD-10-CM | POA: Diagnosis not present

## 2018-02-11 DIAGNOSIS — I10 Essential (primary) hypertension: Secondary | ICD-10-CM | POA: Diagnosis not present

## 2018-02-11 DIAGNOSIS — I2510 Atherosclerotic heart disease of native coronary artery without angina pectoris: Secondary | ICD-10-CM | POA: Diagnosis not present

## 2018-02-11 DIAGNOSIS — I480 Paroxysmal atrial fibrillation: Secondary | ICD-10-CM | POA: Diagnosis not present

## 2018-02-28 DIAGNOSIS — E119 Type 2 diabetes mellitus without complications: Secondary | ICD-10-CM | POA: Diagnosis not present

## 2018-02-28 DIAGNOSIS — E785 Hyperlipidemia, unspecified: Secondary | ICD-10-CM | POA: Diagnosis not present

## 2018-02-28 DIAGNOSIS — I10 Essential (primary) hypertension: Secondary | ICD-10-CM | POA: Diagnosis not present

## 2018-02-28 DIAGNOSIS — M1990 Unspecified osteoarthritis, unspecified site: Secondary | ICD-10-CM | POA: Diagnosis not present

## 2018-03-01 DIAGNOSIS — L570 Actinic keratosis: Secondary | ICD-10-CM | POA: Diagnosis not present

## 2018-03-01 DIAGNOSIS — L821 Other seborrheic keratosis: Secondary | ICD-10-CM | POA: Diagnosis not present

## 2018-07-05 DIAGNOSIS — Z23 Encounter for immunization: Secondary | ICD-10-CM | POA: Diagnosis not present

## 2018-08-19 DIAGNOSIS — E782 Mixed hyperlipidemia: Secondary | ICD-10-CM | POA: Diagnosis not present

## 2018-08-19 DIAGNOSIS — I2510 Atherosclerotic heart disease of native coronary artery without angina pectoris: Secondary | ICD-10-CM | POA: Diagnosis not present

## 2018-08-19 DIAGNOSIS — Z951 Presence of aortocoronary bypass graft: Secondary | ICD-10-CM | POA: Diagnosis not present

## 2018-08-19 DIAGNOSIS — I480 Paroxysmal atrial fibrillation: Secondary | ICD-10-CM | POA: Diagnosis not present

## 2018-09-01 DIAGNOSIS — E785 Hyperlipidemia, unspecified: Secondary | ICD-10-CM | POA: Diagnosis not present

## 2018-09-01 DIAGNOSIS — Z1339 Encounter for screening examination for other mental health and behavioral disorders: Secondary | ICD-10-CM | POA: Diagnosis not present

## 2018-09-01 DIAGNOSIS — I10 Essential (primary) hypertension: Secondary | ICD-10-CM | POA: Diagnosis not present

## 2018-09-01 DIAGNOSIS — E119 Type 2 diabetes mellitus without complications: Secondary | ICD-10-CM | POA: Diagnosis not present

## 2018-09-01 DIAGNOSIS — M1990 Unspecified osteoarthritis, unspecified site: Secondary | ICD-10-CM | POA: Diagnosis not present

## 2018-09-01 DIAGNOSIS — I480 Paroxysmal atrial fibrillation: Secondary | ICD-10-CM | POA: Diagnosis not present

## 2018-09-06 DIAGNOSIS — R0602 Shortness of breath: Secondary | ICD-10-CM | POA: Diagnosis not present

## 2018-09-06 DIAGNOSIS — R0689 Other abnormalities of breathing: Secondary | ICD-10-CM | POA: Diagnosis not present

## 2018-09-06 DIAGNOSIS — M47814 Spondylosis without myelopathy or radiculopathy, thoracic region: Secondary | ICD-10-CM | POA: Diagnosis not present

## 2018-09-06 DIAGNOSIS — R05 Cough: Secondary | ICD-10-CM | POA: Diagnosis not present

## 2018-11-11 DIAGNOSIS — J029 Acute pharyngitis, unspecified: Secondary | ICD-10-CM | POA: Diagnosis not present

## 2018-11-11 DIAGNOSIS — J069 Acute upper respiratory infection, unspecified: Secondary | ICD-10-CM | POA: Diagnosis not present

## 2018-11-11 DIAGNOSIS — Z6827 Body mass index (BMI) 27.0-27.9, adult: Secondary | ICD-10-CM | POA: Diagnosis not present

## 2019-01-18 DIAGNOSIS — Z136 Encounter for screening for cardiovascular disorders: Secondary | ICD-10-CM | POA: Diagnosis not present

## 2019-01-18 DIAGNOSIS — Z1331 Encounter for screening for depression: Secondary | ICD-10-CM | POA: Diagnosis not present

## 2019-01-18 DIAGNOSIS — Z0000 Encounter for general adult medical examination without abnormal findings: Secondary | ICD-10-CM | POA: Diagnosis not present

## 2019-01-18 DIAGNOSIS — Z9181 History of falling: Secondary | ICD-10-CM | POA: Diagnosis not present

## 2019-01-18 DIAGNOSIS — Z23 Encounter for immunization: Secondary | ICD-10-CM | POA: Diagnosis not present

## 2019-01-18 DIAGNOSIS — E785 Hyperlipidemia, unspecified: Secondary | ICD-10-CM | POA: Diagnosis not present

## 2019-02-14 DIAGNOSIS — I480 Paroxysmal atrial fibrillation: Secondary | ICD-10-CM | POA: Diagnosis not present

## 2019-02-14 DIAGNOSIS — Z23 Encounter for immunization: Secondary | ICD-10-CM | POA: Diagnosis not present

## 2019-02-14 DIAGNOSIS — I2510 Atherosclerotic heart disease of native coronary artery without angina pectoris: Secondary | ICD-10-CM | POA: Diagnosis not present

## 2019-02-14 DIAGNOSIS — D539 Nutritional anemia, unspecified: Secondary | ICD-10-CM | POA: Diagnosis not present

## 2019-02-14 DIAGNOSIS — I10 Essential (primary) hypertension: Secondary | ICD-10-CM | POA: Diagnosis not present

## 2019-02-14 DIAGNOSIS — E785 Hyperlipidemia, unspecified: Secondary | ICD-10-CM | POA: Diagnosis not present

## 2019-02-14 DIAGNOSIS — E119 Type 2 diabetes mellitus without complications: Secondary | ICD-10-CM | POA: Diagnosis not present

## 2019-02-16 DIAGNOSIS — E119 Type 2 diabetes mellitus without complications: Secondary | ICD-10-CM | POA: Diagnosis not present

## 2019-02-16 DIAGNOSIS — Z961 Presence of intraocular lens: Secondary | ICD-10-CM | POA: Diagnosis not present

## 2019-02-17 DIAGNOSIS — I480 Paroxysmal atrial fibrillation: Secondary | ICD-10-CM | POA: Diagnosis not present

## 2019-02-17 DIAGNOSIS — I2510 Atherosclerotic heart disease of native coronary artery without angina pectoris: Secondary | ICD-10-CM | POA: Diagnosis not present

## 2019-02-17 DIAGNOSIS — E119 Type 2 diabetes mellitus without complications: Secondary | ICD-10-CM | POA: Diagnosis not present

## 2019-02-17 DIAGNOSIS — E782 Mixed hyperlipidemia: Secondary | ICD-10-CM | POA: Diagnosis not present

## 2019-02-17 DIAGNOSIS — Z951 Presence of aortocoronary bypass graft: Secondary | ICD-10-CM | POA: Diagnosis not present

## 2019-02-17 DIAGNOSIS — I10 Essential (primary) hypertension: Secondary | ICD-10-CM | POA: Diagnosis not present

## 2019-03-09 DIAGNOSIS — L814 Other melanin hyperpigmentation: Secondary | ICD-10-CM | POA: Diagnosis not present

## 2019-03-09 DIAGNOSIS — L570 Actinic keratosis: Secondary | ICD-10-CM | POA: Diagnosis not present

## 2019-03-09 DIAGNOSIS — L821 Other seborrheic keratosis: Secondary | ICD-10-CM | POA: Diagnosis not present

## 2019-03-23 DIAGNOSIS — D539 Nutritional anemia, unspecified: Secondary | ICD-10-CM | POA: Diagnosis not present

## 2019-03-23 DIAGNOSIS — Z6827 Body mass index (BMI) 27.0-27.9, adult: Secondary | ICD-10-CM | POA: Diagnosis not present

## 2019-03-23 DIAGNOSIS — I959 Hypotension, unspecified: Secondary | ICD-10-CM | POA: Diagnosis not present

## 2019-04-25 DIAGNOSIS — Z6826 Body mass index (BMI) 26.0-26.9, adult: Secondary | ICD-10-CM | POA: Diagnosis not present

## 2019-04-25 DIAGNOSIS — D539 Nutritional anemia, unspecified: Secondary | ICD-10-CM | POA: Diagnosis not present

## 2019-04-25 DIAGNOSIS — I959 Hypotension, unspecified: Secondary | ICD-10-CM | POA: Diagnosis not present

## 2019-04-25 DIAGNOSIS — Z139 Encounter for screening, unspecified: Secondary | ICD-10-CM | POA: Diagnosis not present

## 2019-05-08 DIAGNOSIS — J209 Acute bronchitis, unspecified: Secondary | ICD-10-CM | POA: Diagnosis not present

## 2019-05-08 DIAGNOSIS — Z6826 Body mass index (BMI) 26.0-26.9, adult: Secondary | ICD-10-CM | POA: Diagnosis not present

## 2019-06-12 DIAGNOSIS — R002 Palpitations: Secondary | ICD-10-CM | POA: Diagnosis not present

## 2019-06-12 DIAGNOSIS — Z6826 Body mass index (BMI) 26.0-26.9, adult: Secondary | ICD-10-CM | POA: Diagnosis not present

## 2019-06-13 DIAGNOSIS — D649 Anemia, unspecified: Secondary | ICD-10-CM | POA: Diagnosis not present

## 2019-06-15 DIAGNOSIS — D649 Anemia, unspecified: Secondary | ICD-10-CM | POA: Diagnosis not present

## 2019-06-22 DIAGNOSIS — Z6826 Body mass index (BMI) 26.0-26.9, adult: Secondary | ICD-10-CM | POA: Diagnosis not present

## 2019-06-22 DIAGNOSIS — D539 Nutritional anemia, unspecified: Secondary | ICD-10-CM | POA: Diagnosis not present

## 2019-06-22 DIAGNOSIS — R002 Palpitations: Secondary | ICD-10-CM | POA: Diagnosis not present

## 2019-06-27 DIAGNOSIS — D500 Iron deficiency anemia secondary to blood loss (chronic): Secondary | ICD-10-CM | POA: Diagnosis not present

## 2019-06-27 DIAGNOSIS — K921 Melena: Secondary | ICD-10-CM | POA: Diagnosis not present

## 2019-06-28 DIAGNOSIS — K317 Polyp of stomach and duodenum: Secondary | ICD-10-CM | POA: Diagnosis not present

## 2019-06-28 DIAGNOSIS — B3781 Candidal esophagitis: Secondary | ICD-10-CM | POA: Diagnosis not present

## 2019-06-28 DIAGNOSIS — K219 Gastro-esophageal reflux disease without esophagitis: Secondary | ICD-10-CM | POA: Diagnosis not present

## 2019-06-28 DIAGNOSIS — K449 Diaphragmatic hernia without obstruction or gangrene: Secondary | ICD-10-CM | POA: Diagnosis not present

## 2019-06-28 DIAGNOSIS — K921 Melena: Secondary | ICD-10-CM | POA: Diagnosis not present

## 2019-07-12 DIAGNOSIS — K921 Melena: Secondary | ICD-10-CM | POA: Diagnosis not present

## 2019-07-13 DIAGNOSIS — Z23 Encounter for immunization: Secondary | ICD-10-CM | POA: Diagnosis not present

## 2019-07-13 DIAGNOSIS — D539 Nutritional anemia, unspecified: Secondary | ICD-10-CM | POA: Diagnosis not present

## 2019-07-13 DIAGNOSIS — Z6826 Body mass index (BMI) 26.0-26.9, adult: Secondary | ICD-10-CM | POA: Diagnosis not present

## 2019-07-28 DIAGNOSIS — D500 Iron deficiency anemia secondary to blood loss (chronic): Secondary | ICD-10-CM | POA: Diagnosis not present

## 2019-08-01 DIAGNOSIS — K921 Melena: Secondary | ICD-10-CM | POA: Diagnosis not present

## 2019-08-01 DIAGNOSIS — D500 Iron deficiency anemia secondary to blood loss (chronic): Secondary | ICD-10-CM | POA: Diagnosis not present

## 2019-08-01 DIAGNOSIS — B3781 Candidal esophagitis: Secondary | ICD-10-CM | POA: Diagnosis not present

## 2019-08-03 DIAGNOSIS — D500 Iron deficiency anemia secondary to blood loss (chronic): Secondary | ICD-10-CM | POA: Diagnosis not present

## 2019-08-17 DIAGNOSIS — I2510 Atherosclerotic heart disease of native coronary artery without angina pectoris: Secondary | ICD-10-CM | POA: Diagnosis not present

## 2019-08-17 DIAGNOSIS — E785 Hyperlipidemia, unspecified: Secondary | ICD-10-CM | POA: Diagnosis not present

## 2019-08-17 DIAGNOSIS — E119 Type 2 diabetes mellitus without complications: Secondary | ICD-10-CM | POA: Diagnosis not present

## 2019-08-17 DIAGNOSIS — I10 Essential (primary) hypertension: Secondary | ICD-10-CM | POA: Diagnosis not present

## 2019-08-17 DIAGNOSIS — Z20828 Contact with and (suspected) exposure to other viral communicable diseases: Secondary | ICD-10-CM | POA: Diagnosis not present

## 2019-08-17 DIAGNOSIS — I480 Paroxysmal atrial fibrillation: Secondary | ICD-10-CM | POA: Diagnosis not present

## 2019-08-18 NOTE — Progress Notes (Signed)
Cardiology Duncan Note:    Date:  08/21/2019   ID:  Austin Duncan, DOB 1934-11-23, MRN [id number]  PCP:  Austin Dandy, NP  Cardiologist:  Austin More, MD   Referring MD: Austin Dandy, NP  ASSESSMENT:    1. Coronary artery disease of native artery of native heart with stable angina pectoris ([place])   2. Hx of CABG   3. Mixed hyperlipidemia   4. Essential hypertension   5. PAF (paroxysmal atrial fibrillation) ([place])   6. Type 2 diabetes mellitus without complication, without long-term current use of insulin (HCC)   7. Carotid stenosis, bilateral    PLAN:    In order of problems listed above:  1. Stable CAD after bypass surgery New York Heart Association class I having no angina on current medical therapy continue beta-blocker statin and will have him stop his aspirin and to anticipation of colonoscopy. 2. Stable dyslipidemia continue statin 3. Hypertension controlled continue treatment including beta-blocker 4. No recurrence of atrial fibrillation no longer on amiodarone 5. Stable type 2 diabetes currently not on medical treatment 6. No bruit on exam sitter follow-up duplex around the time of next visit  Next appointment 6 months   Medication Adjustments/Labs and Tests Ordered: Current medicines are reviewed at length with the patient today.  Concerns regarding medicines are outlined above.  No orders of the defined types were placed in this encounter.  No orders of the defined types were placed in this encounter.    No chief complaint on file.   History of Present Illness:    Austin Duncan is Duncan 84 y.o. male who is being seen today for the evaluation of palpitation at the request of Moon, Amy A, NP. Records reviewed care everywhere shows that Austin Duncan is been followed most recently by Austin Duncan with Duncan history of CAD bypass surgery 09/16/2017 paroxysmal atrial fibrillation after bypass surgery hyperlipidemia and hypertension.  The time  bypass surgery of the left thoracic artery anastomosis to LAD and vein graft to the right posterior lateral branch.  He had no target for bypass to left circumflex coronary vessel.  Medially after surgery he was on amiodarone for atrial fibrillation.  His other medical problems include hypertension hyperlipidemia type 2 diabetes. His heart catheterization performed 09/15/2017 showed 99% ostial right coronary artery stenosis with left-to-right collaterals ostial 70% left main distal 65% stenosis and moderate LAD disease with EF of 65 to 70%. His most recent labs available in epic 0 10/18/2017 show cholesterol 108 triglycerides 44 HDL 51 LDL 48.  At that time TSH was normal and CMP was normal except for glucose 142 reoperative carotid duplex showing 40 to 59% stenosis internal carotid artery bilaterally.  Recently felt very weak had palpitation tells me his hemoglobin is between 7 and 8 received 2 units of blood subsequently seen by GI Dr. Melina Duncan had an upper endoscopy showing yeast after he took antibiotics for respiratory infection his stool he tells me is hematest positive and is being planned for endoscopy.  He is told asked me if he could stop aspirin.  He feels markedly improved presently not having palpitation shortness of breath chest pain or fatigue.  His last hemoglobin was greater than 12 and I did access labs from left [facility] showing ferritin diminished at 19.9 but his iron saturation is normal at 70% hemoglobin 13.2.  In anticipation of colonoscopy I told him to stop aspirin.  At this time I do not think  he requires an ischemia evaluation or ambulatory heart rhythm monitors he is in sinus rhythm today.  Has had no overt bleeding or dyspeptic symptoms.  Past Medical History:  Diagnosis Date   Coronary artery disease    Hyperlipidemia    Hypertension     Past Surgical History:  Procedure Laterality Date   CARDIAC CATHETERIZATION     CORONARY ARTERY BYPASS GRAFT     TOTAL KNEE  ARTHROPLASTY Bilateral     Current Medications: Current Meds  Medication Sig   aspirin EC 81 MG tablet Take 81 mg by mouth daily.   atorvastatin (LIPITOR) 40 MG tablet Take 1 tablet by mouth daily.   FEROSUL 325 (65 Fe) MG tablet Take 325 mg by mouth daily.   furosemide (LASIX) 20 MG tablet Take 1 tablet by mouth daily.   metoprolol tartrate (LOPRESSOR) 25 MG tablet TAKE 1/2 TABLET(12.5 MG) BY MOUTH TWICE DAILY   omeprazole (PRILOSEC) 20 MG capsule TAKE 1 CAPSULE BY MOUTH ONCE DAILY.   potassium chloride SA (KLOR-CON) 20 MEQ tablet Take 1 tablet by mouth daily.   tamsulosin (FLOMAX) 0.4 MG CAPS capsule Take 1 capsule by mouth daily.     Allergies:   Patient has no known allergies.   Social History   Socioeconomic History   Marital status: Married    Spouse name: Not on file   Number of children: Not on file   Years of education: Not on file   Highest education level: Not on file  Occupational History   Not on file  Social Needs   Financial resource strain: Not on file   Food insecurity    Worry: Not on file    Inability: Not on file   Transportation needs    Medical: Not on file    Non-medical: Not on file  Tobacco Use   Smoking status: Never Smoker   Smokeless tobacco: Never Used  Substance and Sexual Activity   Alcohol use: Never    Frequency: Never   Drug use: Never   Sexual activity: Not on file  Lifestyle   Physical activity    Days per week: Not on file    Minutes per session: Not on file   Stress: Not on file  Relationships   Social connections    Talks on phone: Not on file    Gets together: Not on file    Attends religious service: Not on file    Active member of club or organization: Not on file    Attends meetings of clubs or organizations: Not on file    Relationship status: Not on file  Other Topics Concern   Not on file  Social History Narrative   Not on file     Family History: The patient's family history  includes Cancer in his mother; Heart attack in his father.  ROS:   Review of Systems  Constitution: Positive for malaise/fatigue.  HENT: Negative.   Eyes: Negative.   Cardiovascular: Negative.   Respiratory: Negative.   Endocrine: Negative.   Hematologic/Lymphatic: Negative.   Skin: Negative.   Musculoskeletal: Negative.   Gastrointestinal: Negative.   Genitourinary: Negative.   Psychiatric/Behavioral: Negative.   Allergic/Immunologic: Negative.    Please see the history of present illness.     All other systems reviewed and are negative.  EKGs/Labs/Other Studies Reviewed:    The following studies were reviewed today: \  EKG:  EKG is  ordered today.  The ekg ordered today is personally reviewed and demonstrates sinus  rhythm nonspecific ST and T abnormality    Physical Exam:    VS:  BP 122/70 (BP Location: Left Arm, Patient Position: Sitting, Cuff Size: Normal)    Pulse 70    Ht 5\' 4"  (1.626 m)    Wt 156 lb (70.8 kg)    SpO2 97%    BMI 26.78 kg/m     Wt Readings from Last 3 Encounters:  08/21/19 156 lb (70.8 kg)     GEN:  Well nourished, well developed in no acute distress no pallor the skin remember HEENT: Normal NECK: No JVD; No carotid bruits LYMPHATICS: No lymphadenopathy CARDIAC: RRR, no murmurs, rubs, gallops RESPIRATORY:  Clear to auscultation without rales, wheezing or rhonchi  ABDOMEN: Soft, non-tender, non-distended MUSCULOSKELETAL:  No edema; No deformity  SKIN: Warm and dry NEUROLOGIC:  Alert and oriented x 3 PSYCHIATRIC:  Normal affect     Signed, Austin More, MD  08/21/2019 1:39 PM    [facility]

## 2019-08-21 ENCOUNTER — Encounter: Payer: Self-pay | Admitting: Cardiology

## 2019-08-21 ENCOUNTER — Other Ambulatory Visit: Payer: Self-pay

## 2019-08-21 ENCOUNTER — Ambulatory Visit (INDEPENDENT_AMBULATORY_CARE_PROVIDER_SITE_OTHER): Payer: Medicare Other | Admitting: Cardiology

## 2019-08-21 VITALS — BP 122/70 | HR 70 | Ht 64.0 in | Wt 156.0 lb

## 2019-08-21 DIAGNOSIS — I25118 Atherosclerotic heart disease of native coronary artery with other forms of angina pectoris: Secondary | ICD-10-CM | POA: Diagnosis not present

## 2019-08-21 DIAGNOSIS — Z951 Presence of aortocoronary bypass graft: Secondary | ICD-10-CM

## 2019-08-21 DIAGNOSIS — E119 Type 2 diabetes mellitus without complications: Secondary | ICD-10-CM

## 2019-08-21 DIAGNOSIS — I10 Essential (primary) hypertension: Secondary | ICD-10-CM

## 2019-08-21 DIAGNOSIS — I480 Paroxysmal atrial fibrillation: Secondary | ICD-10-CM

## 2019-08-21 DIAGNOSIS — E782 Mixed hyperlipidemia: Secondary | ICD-10-CM

## 2019-08-21 DIAGNOSIS — I6523 Occlusion and stenosis of bilateral carotid arteries: Secondary | ICD-10-CM

## 2019-08-21 NOTE — Patient Instructions (Signed)
Medication Instructions:  Your physician has recommended you make the following change in your medication:   STOP aspirin  *If you need a refill on your cardiac medications before your next appointment, please call your pharmacy*  Lab Work: None  If you have labs (blood work) drawn today and your tests are completely normal, you will receive your results only by: Marland Kitchen MyChart Message (if you have MyChart) OR . A paper copy in the mail If you have any lab test that is abnormal or we need to change your treatment, we will call you to review the results.  Testing/Procedures: You had an EKG today.   Follow-Up: At [facility], you and your health needs are our priority.  As part of our continuing mission to provide you with exceptional heart care, we have created designated Provider Care Teams.  These Care Teams include your primary Cardiologist (physician) and Advanced Practice Providers (APPs -  Physician Assistants and Nurse Practitioners) who all work together to provide you with the care you need, when you need it.  Your next appointment:   6 months  The format for your next appointment:   In Person  Provider:   Shirlee More, MD

## 2019-12-08 DIAGNOSIS — C44629 Squamous cell carcinoma of skin of left upper limb, including shoulder: Secondary | ICD-10-CM | POA: Diagnosis not present

## 2019-12-08 DIAGNOSIS — C44622 Squamous cell carcinoma of skin of right upper limb, including shoulder: Secondary | ICD-10-CM | POA: Diagnosis not present

## 2019-12-19 DIAGNOSIS — D500 Iron deficiency anemia secondary to blood loss (chronic): Secondary | ICD-10-CM | POA: Diagnosis not present

## 2019-12-21 DIAGNOSIS — K921 Melena: Secondary | ICD-10-CM | POA: Diagnosis not present

## 2019-12-21 DIAGNOSIS — D500 Iron deficiency anemia secondary to blood loss (chronic): Secondary | ICD-10-CM | POA: Diagnosis not present

## 2020-01-23 DIAGNOSIS — Z1331 Encounter for screening for depression: Secondary | ICD-10-CM | POA: Diagnosis not present

## 2020-01-23 DIAGNOSIS — E785 Hyperlipidemia, unspecified: Secondary | ICD-10-CM | POA: Diagnosis not present

## 2020-01-23 DIAGNOSIS — Z9181 History of falling: Secondary | ICD-10-CM | POA: Diagnosis not present

## 2020-01-23 DIAGNOSIS — Z0000 Encounter for general adult medical examination without abnormal findings: Secondary | ICD-10-CM | POA: Diagnosis not present

## 2020-01-24 DIAGNOSIS — M1990 Unspecified osteoarthritis, unspecified site: Secondary | ICD-10-CM | POA: Diagnosis present

## 2020-01-24 DIAGNOSIS — Z8673 Personal history of transient ischemic attack (TIA), and cerebral infarction without residual deficits: Secondary | ICD-10-CM | POA: Diagnosis not present

## 2020-01-24 DIAGNOSIS — I739 Peripheral vascular disease, unspecified: Secondary | ICD-10-CM | POA: Diagnosis present

## 2020-01-24 DIAGNOSIS — R519 Headache, unspecified: Secondary | ICD-10-CM | POA: Diagnosis present

## 2020-01-24 DIAGNOSIS — D649 Anemia, unspecified: Secondary | ICD-10-CM | POA: Diagnosis present

## 2020-01-24 DIAGNOSIS — I2510 Atherosclerotic heart disease of native coronary artery without angina pectoris: Secondary | ICD-10-CM | POA: Diagnosis not present

## 2020-01-24 DIAGNOSIS — E785 Hyperlipidemia, unspecified: Secondary | ICD-10-CM | POA: Diagnosis not present

## 2020-01-24 DIAGNOSIS — E872 Acidosis: Secondary | ICD-10-CM | POA: Diagnosis present

## 2020-01-24 DIAGNOSIS — I519 Heart disease, unspecified: Secondary | ICD-10-CM | POA: Diagnosis not present

## 2020-01-24 DIAGNOSIS — R031 Nonspecific low blood-pressure reading: Secondary | ICD-10-CM | POA: Diagnosis not present

## 2020-01-24 DIAGNOSIS — R109 Unspecified abdominal pain: Secondary | ICD-10-CM | POA: Diagnosis not present

## 2020-01-24 DIAGNOSIS — I959 Hypotension, unspecified: Secondary | ICD-10-CM | POA: Diagnosis not present

## 2020-01-24 DIAGNOSIS — Z8679 Personal history of other diseases of the circulatory system: Secondary | ICD-10-CM | POA: Diagnosis not present

## 2020-01-24 DIAGNOSIS — N400 Enlarged prostate without lower urinary tract symptoms: Secondary | ICD-10-CM | POA: Diagnosis present

## 2020-01-24 DIAGNOSIS — R404 Transient alteration of awareness: Secondary | ICD-10-CM | POA: Diagnosis not present

## 2020-01-24 DIAGNOSIS — M79603 Pain in arm, unspecified: Secondary | ICD-10-CM | POA: Diagnosis not present

## 2020-01-24 DIAGNOSIS — I361 Nonrheumatic tricuspid (valve) insufficiency: Secondary | ICD-10-CM | POA: Diagnosis not present

## 2020-01-24 DIAGNOSIS — K529 Noninfective gastroenteritis and colitis, unspecified: Secondary | ICD-10-CM | POA: Diagnosis not present

## 2020-01-24 DIAGNOSIS — R4020 Unspecified coma: Secondary | ICD-10-CM | POA: Diagnosis not present

## 2020-01-24 DIAGNOSIS — Z79899 Other long term (current) drug therapy: Secondary | ICD-10-CM | POA: Diagnosis not present

## 2020-01-24 DIAGNOSIS — E876 Hypokalemia: Secondary | ICD-10-CM | POA: Diagnosis not present

## 2020-01-24 DIAGNOSIS — I5032 Chronic diastolic (congestive) heart failure: Secondary | ICD-10-CM | POA: Diagnosis present

## 2020-01-24 DIAGNOSIS — R42 Dizziness and giddiness: Secondary | ICD-10-CM | POA: Diagnosis not present

## 2020-01-24 DIAGNOSIS — I252 Old myocardial infarction: Secondary | ICD-10-CM | POA: Diagnosis not present

## 2020-01-24 DIAGNOSIS — I110 Hypertensive heart disease with heart failure: Secondary | ICD-10-CM | POA: Diagnosis present

## 2020-01-24 DIAGNOSIS — Z7982 Long term (current) use of aspirin: Secondary | ICD-10-CM | POA: Diagnosis not present

## 2020-01-24 DIAGNOSIS — Z951 Presence of aortocoronary bypass graft: Secondary | ICD-10-CM | POA: Diagnosis not present

## 2020-01-24 DIAGNOSIS — I340 Nonrheumatic mitral (valve) insufficiency: Secondary | ICD-10-CM | POA: Diagnosis not present

## 2020-01-24 DIAGNOSIS — R0781 Pleurodynia: Secondary | ICD-10-CM | POA: Diagnosis not present

## 2020-01-24 DIAGNOSIS — I6523 Occlusion and stenosis of bilateral carotid arteries: Secondary | ICD-10-CM | POA: Diagnosis not present

## 2020-01-24 DIAGNOSIS — R55 Syncope and collapse: Secondary | ICD-10-CM | POA: Diagnosis not present

## 2020-01-24 DIAGNOSIS — R52 Pain, unspecified: Secondary | ICD-10-CM | POA: Diagnosis not present

## 2020-01-24 DIAGNOSIS — I6521 Occlusion and stenosis of right carotid artery: Secondary | ICD-10-CM | POA: Diagnosis present

## 2020-01-25 DIAGNOSIS — E785 Hyperlipidemia, unspecified: Secondary | ICD-10-CM

## 2020-01-25 DIAGNOSIS — I739 Peripheral vascular disease, unspecified: Secondary | ICD-10-CM

## 2020-01-25 DIAGNOSIS — I2510 Atherosclerotic heart disease of native coronary artery without angina pectoris: Secondary | ICD-10-CM

## 2020-01-25 DIAGNOSIS — R55 Syncope and collapse: Secondary | ICD-10-CM

## 2020-01-26 DIAGNOSIS — R55 Syncope and collapse: Secondary | ICD-10-CM | POA: Diagnosis not present

## 2020-01-27 DIAGNOSIS — R55 Syncope and collapse: Secondary | ICD-10-CM

## 2020-01-27 DIAGNOSIS — E785 Hyperlipidemia, unspecified: Secondary | ICD-10-CM

## 2020-01-27 DIAGNOSIS — I959 Hypotension, unspecified: Secondary | ICD-10-CM

## 2020-01-27 DIAGNOSIS — I2510 Atherosclerotic heart disease of native coronary artery without angina pectoris: Secondary | ICD-10-CM

## 2020-02-02 DIAGNOSIS — I6523 Occlusion and stenosis of bilateral carotid arteries: Secondary | ICD-10-CM | POA: Diagnosis not present

## 2020-02-02 DIAGNOSIS — R55 Syncope and collapse: Secondary | ICD-10-CM | POA: Diagnosis not present

## 2020-02-02 DIAGNOSIS — I10 Essential (primary) hypertension: Secondary | ICD-10-CM | POA: Diagnosis not present

## 2020-02-02 DIAGNOSIS — Z6828 Body mass index (BMI) 28.0-28.9, adult: Secondary | ICD-10-CM | POA: Diagnosis not present

## 2020-02-02 DIAGNOSIS — K529 Noninfective gastroenteritis and colitis, unspecified: Secondary | ICD-10-CM | POA: Diagnosis not present

## 2020-02-02 DIAGNOSIS — Z79899 Other long term (current) drug therapy: Secondary | ICD-10-CM | POA: Diagnosis not present

## 2020-02-08 DIAGNOSIS — I6521 Occlusion and stenosis of right carotid artery: Secondary | ICD-10-CM | POA: Diagnosis not present

## 2020-02-08 DIAGNOSIS — I6523 Occlusion and stenosis of bilateral carotid arteries: Secondary | ICD-10-CM | POA: Diagnosis not present

## 2020-02-15 DIAGNOSIS — D539 Nutritional anemia, unspecified: Secondary | ICD-10-CM | POA: Diagnosis not present

## 2020-02-15 DIAGNOSIS — I480 Paroxysmal atrial fibrillation: Secondary | ICD-10-CM | POA: Diagnosis not present

## 2020-02-15 DIAGNOSIS — I2510 Atherosclerotic heart disease of native coronary artery without angina pectoris: Secondary | ICD-10-CM | POA: Diagnosis not present

## 2020-02-15 DIAGNOSIS — E1165 Type 2 diabetes mellitus with hyperglycemia: Secondary | ICD-10-CM | POA: Diagnosis not present

## 2020-02-27 DIAGNOSIS — E119 Type 2 diabetes mellitus without complications: Secondary | ICD-10-CM | POA: Diagnosis not present

## 2020-02-27 DIAGNOSIS — Z961 Presence of intraocular lens: Secondary | ICD-10-CM | POA: Diagnosis not present

## 2020-03-04 ENCOUNTER — Ambulatory Visit: Payer: PRIVATE HEALTH INSURANCE | Admitting: Cardiology

## 2020-03-28 ENCOUNTER — Ambulatory Visit (INDEPENDENT_AMBULATORY_CARE_PROVIDER_SITE_OTHER): Payer: Medicare Other | Admitting: Cardiology

## 2020-03-28 ENCOUNTER — Other Ambulatory Visit: Payer: Self-pay

## 2020-03-28 ENCOUNTER — Encounter: Payer: Self-pay | Admitting: Cardiology

## 2020-03-28 VITALS — BP 126/64 | HR 72 | Ht 64.0 in | Wt 158.8 lb

## 2020-03-28 DIAGNOSIS — I25118 Atherosclerotic heart disease of native coronary artery with other forms of angina pectoris: Secondary | ICD-10-CM | POA: Diagnosis not present

## 2020-03-28 DIAGNOSIS — E782 Mixed hyperlipidemia: Secondary | ICD-10-CM | POA: Diagnosis not present

## 2020-03-28 DIAGNOSIS — R55 Syncope and collapse: Secondary | ICD-10-CM

## 2020-03-28 DIAGNOSIS — I10 Essential (primary) hypertension: Secondary | ICD-10-CM

## 2020-03-28 DIAGNOSIS — Z951 Presence of aortocoronary bypass graft: Secondary | ICD-10-CM | POA: Diagnosis not present

## 2020-03-28 NOTE — Patient Instructions (Signed)

## 2020-03-28 NOTE — Progress Notes (Signed)
Cardiology Office Note:    Date:  03/28/2020   ID:  Austin Duncan, DOB 1935/04/30, MRN [id number]  PCP:  Austin Dandy, NP  Cardiologist:  Austin More, MD    Referring MD: Austin Dandy, NP    ASSESSMENT:    1. Syncope and collapse   2. Orthostatic hypertension   3. Coronary artery disease of native artery of native heart with stable angina pectoris ([place])   4. Hx of CABG   5. Mixed hyperlipidemia   6. Essential hypertension    PLAN:    In order of problems listed above:  1. He clearly is improved and has had no recurrence he decided not to wear an event monitor at this time I would do further evaluation. 2. Stable CAD New York Heart Association class I at this time is not taking aspirin with iron deficiency anemia and declines lipid-lowering therapy with statin 3. Stable hypertension BP at target with beta-blocker   Next appointment: 6 months   Medication Adjustments/Labs and Tests Ordered: Current medicines are reviewed at length with the patient today.  Concerns regarding medicines are outlined above.  No orders of the defined types were placed in this encounter.  No orders of the defined types were placed in this encounter.   Chief Complaint  Patient presents with   Follow-up    After recent [facility] admission with   Loss of Consciousness   Coronary Artery Disease   Hypertension    History of Present Illness:    Austin Duncan is a 85 y.o. male with a hx of CABG and recent orthostatic syncope last seen while admitted to [facility]. Compliance with diet, lifestyle and medications: Yes  He was seen by me during [place] admission most recently at discharge 01/27/2019 when he had syncope orthostatic in nature is asked to wear knee-high support hose diuretic was discontinued beta-blocker was reduced to once daily and arrangements were made for 2-week ambulatory event monitor and subsequent follow-up.  He had no ventricular arrhythmia or  bradycardia while in the hospital and had orthostatic hypotension documented his coronary artery disease with stable New York Association class I and he had no ischemia on his perfusion study.  He underwent a myocardial perfusion study during that admission that showed scarring in the apex and distal inferolateral segment with hypokinesia EF 51% and no ischemia laboratory studies showed a hemoglobin of 9.8 with a known anemia he is on iron platelets 147,000 potassium 3.8 sodium 136 GFR greater than 60 cc and normal liver function test cholesterol 75 LDL 23 HDL 44 triglyceride 40.  His E[facility] at [facility] showed sinus rhythm and T wave inversions.  Following discharge from the hospital he made a decision to withdraw all medications except Meprazole metoprolol and iron.  He feels well no recurrent orthostatic lightheadedness no muscle weakness and he does not intend to restart lipid-lowering therapy.  He is very concerned about his ability to care for his wife who is dependent he has had no angina edema or shortness of breath Past Medical History:  Diagnosis Date   Carotid stenosis, bilateral    Coronary artery disease    History of amiodarone therapy    Hyperlipidemia    Hypertension    PAF (paroxysmal atrial fibrillation) (HCC)    Type 2 diabetes mellitus ([place])     Past Surgical History:  Procedure Laterality Date   CARDIAC CATHETERIZATION     CORONARY ARTERY BYPASS GRAFT     TOTAL KNEE  ARTHROPLASTY Bilateral     Current Medications: Current Meds  Medication Sig   FEROSUL 325 (65 Fe) MG tablet Take 325 mg by mouth daily.   metoprolol tartrate (LOPRESSOR) 25 MG tablet TAKE 1/2 TABLET(12.5 MG) BY MOUTH TWICE DAILY   omeprazole (PRILOSEC) 20 MG capsule TAKE 1 CAPSULE BY MOUTH ONCE DAILY.     Allergies:   Patient has no known allergies.   Social History   Socioeconomic History   Marital status: Married    Spouse name: Not on file   Number of children: Not on file    Years of education: Not on file   Highest education level: Not on file  Occupational History   Not on file  Tobacco Use   Smoking status: Never Smoker   Smokeless tobacco: Never Used  Vaping Use   Vaping Use: Never assessed  Substance and Sexual Activity   Alcohol use: Never   Drug use: Never   Sexual activity: Not on file  Other Topics Concern   Not on file  Social History Narrative   Not on file   Social Determinants of Health   Financial Resource Strain:    Difficulty of Paying Living Expenses:   Food Insecurity:    Worried About Charity fundraiser in the Last Year:    Arboriculturist in the Last Year:   Transportation Needs:    Film/video editor (Medical):    Lack of Transportation (Non-Medical):   Physical Activity:    Days of Exercise per Week:    Minutes of Exercise per Session:   Stress:    Feeling of Stress :   Social Connections:    Frequency of Communication with Friends and Family:    Frequency of Social Gatherings with Friends and Family:    Attends Religious Services:    Active Member of Clubs or Organizations:    Attends Archivist Meetings:    Marital Status:      Family History: The patient's family history includes Cancer in his mother; Heart attack in his father. ROS:   Please see the history of present illness.    All other systems reviewed and are negative.  EKGs/Labs/Other Studies Reviewed:    The following studies were reviewed today:    Physical Exam:    VS:  BP 126/64 (BP Location: Right Arm, Patient Position: Sitting, Cuff Size: Normal)    Pulse 72    Ht 5\' 4"  (1.626 m) Comment: Simultaneous filing. User may not have seen previous data.   Wt 158 lb 12.8 oz (72 kg)    SpO2 96%    BMI 27.26 kg/m     Wt Readings from Last 3 Encounters:  03/28/20 158 lb 12.8 oz (72 kg)  08/21/19 156 lb (70.8 kg)    He has no change in standing blood pressure 128/70 GEN:  Well nourished, well developed in no  acute distress HEENT: Normal NECK: No JVD; No carotid bruits LYMPHATICS: No lymphadenopathy CARDIAC: One-time problem is RRR, no murmurs, rubs, gallops RESPIRATORY:  Clear to auscultation without rales, wheezing or rhonchi  ABDOMEN: Soft, non-tender, non-distended MUSCULOSKELETAL:  No edema; No deformity  SKIN: Warm and dry NEUROLOGIC:  Alert and oriented x 3 PSYCHIATRIC:  Normal affect    Signed, Austin More, MD  03/28/2020 1:56 PM    [facility]

## 2020-07-05 DIAGNOSIS — D500 Iron deficiency anemia secondary to blood loss (chronic): Secondary | ICD-10-CM | POA: Diagnosis not present

## 2020-07-25 DIAGNOSIS — D500 Iron deficiency anemia secondary to blood loss (chronic): Secondary | ICD-10-CM | POA: Diagnosis not present

## 2020-07-25 DIAGNOSIS — R195 Other fecal abnormalities: Secondary | ICD-10-CM | POA: Diagnosis not present

## 2020-08-14 DIAGNOSIS — D500 Iron deficiency anemia secondary to blood loss (chronic): Secondary | ICD-10-CM | POA: Diagnosis not present

## 2020-08-14 DIAGNOSIS — D509 Iron deficiency anemia, unspecified: Secondary | ICD-10-CM | POA: Diagnosis not present

## 2020-08-14 DIAGNOSIS — R195 Other fecal abnormalities: Secondary | ICD-10-CM | POA: Diagnosis not present

## 2020-08-16 DIAGNOSIS — D485 Neoplasm of uncertain behavior of skin: Secondary | ICD-10-CM | POA: Diagnosis not present

## 2020-08-19 DIAGNOSIS — K219 Gastro-esophageal reflux disease without esophagitis: Secondary | ICD-10-CM | POA: Diagnosis not present

## 2020-08-19 DIAGNOSIS — Z6828 Body mass index (BMI) 28.0-28.9, adult: Secondary | ICD-10-CM | POA: Diagnosis not present

## 2020-08-19 DIAGNOSIS — E785 Hyperlipidemia, unspecified: Secondary | ICD-10-CM | POA: Diagnosis not present

## 2020-08-19 DIAGNOSIS — E1165 Type 2 diabetes mellitus with hyperglycemia: Secondary | ICD-10-CM | POA: Diagnosis not present

## 2020-08-19 DIAGNOSIS — D539 Nutritional anemia, unspecified: Secondary | ICD-10-CM | POA: Diagnosis not present

## 2020-08-19 DIAGNOSIS — M1990 Unspecified osteoarthritis, unspecified site: Secondary | ICD-10-CM | POA: Diagnosis not present

## 2020-08-19 DIAGNOSIS — I10 Essential (primary) hypertension: Secondary | ICD-10-CM | POA: Diagnosis not present

## 2020-08-19 DIAGNOSIS — I6523 Occlusion and stenosis of bilateral carotid arteries: Secondary | ICD-10-CM | POA: Diagnosis not present

## 2020-08-19 DIAGNOSIS — I2510 Atherosclerotic heart disease of native coronary artery without angina pectoris: Secondary | ICD-10-CM | POA: Diagnosis not present

## 2020-08-19 DIAGNOSIS — I480 Paroxysmal atrial fibrillation: Secondary | ICD-10-CM | POA: Diagnosis not present

## 2020-09-09 DIAGNOSIS — Z23 Encounter for immunization: Secondary | ICD-10-CM | POA: Diagnosis not present

## 2020-09-13 DIAGNOSIS — Z9229 Personal history of other drug therapy: Secondary | ICD-10-CM | POA: Insufficient documentation

## 2020-09-13 DIAGNOSIS — I6523 Occlusion and stenosis of bilateral carotid arteries: Secondary | ICD-10-CM | POA: Insufficient documentation

## 2020-09-13 DIAGNOSIS — I10 Essential (primary) hypertension: Secondary | ICD-10-CM | POA: Insufficient documentation

## 2020-09-13 DIAGNOSIS — I2510 Atherosclerotic heart disease of native coronary artery without angina pectoris: Secondary | ICD-10-CM | POA: Insufficient documentation

## 2020-09-13 DIAGNOSIS — E119 Type 2 diabetes mellitus without complications: Secondary | ICD-10-CM | POA: Insufficient documentation

## 2020-09-13 DIAGNOSIS — I480 Paroxysmal atrial fibrillation: Secondary | ICD-10-CM | POA: Insufficient documentation

## 2020-09-13 DIAGNOSIS — E785 Hyperlipidemia, unspecified: Secondary | ICD-10-CM | POA: Insufficient documentation

## 2020-09-30 NOTE — Progress Notes (Signed)
Cardiology Office Note:    Date:  10/01/2020   ID:  Austin Duncan, DOB 1935/09/07, MRN [id number]  PCP:  Lowella Dandy, NP  Cardiologist:  Shirlee More, MD    Referring MD: Lowella Dandy, NP    ASSESSMENT:    1. Orthostatic hypertension   2. Coronary artery disease of native artery of native heart with stable angina pectoris ([place])   3. Mixed hyperlipidemia    PLAN:    In order of problems listed above:  1. Improved no orthostatic symptoms BP at target continue his beta-blocker with CAD 2. Stable CAD New York Heart Association class I continue beta-blocker and statin.  Not on aspirin no previous GI bleed 3. Stable hypertension continue furosemide low-dose beta-blocker 4. Recently restarted on statin high risk CAD 5. I reviewed his findings of mild carotid disease previously no bruit he is on appropriate treatment at this point time he does not feel compelled to repeat carotid duplex.   Next appointment: 1 year   Medication Adjustments/Labs and Tests Ordered: Current medicines are reviewed at length with the patient today.  Concerns regarding medicines are outlined above.  No orders of the defined types were placed in this encounter.  No orders of the defined types were placed in this encounter.   Chief Complaint  Patient presents with  . Follow-up  . Hypotension  . Coronary Artery Disease  . Hyperlipidemia    History of Present Illness:    Austin Duncan is a 85 y.o. male with a hx of CAD and CABG December 2018 with a left thoracic artery anastomosis to the LAD vein graft to the posterior lateral branch of the right coronary artery and there is no graftable target of the left circumflex system he had severe ostial right coronary and left main coronary artery stenosis and symptomatic orthostatic hypotension last seen 03/28/2020.  He also has a history of carotid stenosis in December 2018 he had 40 to 59% stenosis right internal carotid artery and lower range 60 to 79%  stenosis left internal carotid artery with bilateral antegrade flow in his carotids.  Compliance with diet, lifestyle and medications: Yes  [name] is doing quite well lives independently and is supervised by family.  Recently was put back on a statin and he tolerates it without muscle pain or weakness.  Had no recurrent orthostatic lightheadedness or syncope tells me his iron is repleted and recent labs are at target cholesterol 188 LDL 121 restarting his statin triglycerides 77 HDL 53 creatinine 0.82.  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 Past Medical History:  Diagnosis Date  . Carotid stenosis, bilateral   . Coronary artery disease   . History of amiodarone therapy   . Hyperlipidemia   . Hypertension   . PAF (paroxysmal atrial fibrillation) ([place])   . Type 2 diabetes mellitus ([place])     Past Surgical History:  Procedure Laterality Date  . CARDIAC CATHETERIZATION    . CORONARY ARTERY BYPASS GRAFT    . TOTAL KNEE ARTHROPLASTY Bilateral     Current Medications: Current Meds  Medication Sig  . atorvastatin (LIPITOR) 20 MG tablet Take 20 mg by mouth daily.  . FEROSUL 325 (65 Fe) MG tablet Take 325 mg by mouth daily.  . furosemide (LASIX) 20 MG tablet Take 1 tablet by mouth daily.  . metoprolol tartrate (LOPRESSOR) 25 MG tablet TAKE 1/2 TABLET(12.5 MG) BY MOUTH TWICE DAILY  . omeprazole (PRILOSEC) 20 MG capsule TAKE 1 CAPSULE BY MOUTH  ONCE DAILY.     Allergies:   Patient has no known allergies.   Social History   Socioeconomic History  . Marital status: Married    Spouse name: Not on file  . Number of children: Not on file  . Years of education: Not on file  . Highest education level: Not on file  Occupational History  . Not on file  Tobacco Use  . Smoking status: Never Smoker  . Smokeless tobacco: Never Used  Vaping Use  . Vaping Use: Not on file  Substance and Sexual Activity  . Alcohol use: Never  . Drug use: Never  . Sexual activity: Not  on file  Other Topics Concern  . Not on file  Social History Narrative  . Not on file   Social Determinants of Health   Financial Resource Strain: Not on file  Food Insecurity: Not on file  Transportation Needs: Not on file  Physical Activity: Not on file  Stress: Not on file  Social Connections: Not on file     Family History: The patient's family history includes Cancer in his mother; Heart attack in his father. ROS:   Please see the history of present illness.    All other systems reviewed and are negative.  EKGs/Labs/Other Studies Reviewed:    The following studies were reviewed today:  EKG:  EKG ordered today and personally reviewed.  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 unchanged from 09/01/2019    Physical Exam:    VS:  BP 124/60   Pulse 80   Ht 5\' 4"  (1.626 m)   Wt 164 lb (74.4 kg)   SpO2 98%   BMI 28.15 kg/m     Wt Readings from Last 3 Encounters:  10/01/20 164 lb (74.4 kg)  03/28/20 158 lb 12.8 oz (72 kg)  08/21/19 156 lb (70.8 kg)     GEN: Appears his age well nourished, well developed in no acute distress HEENT: Normal NECK: No JVD; No carotid bruits LYMPHATICS: No lymphadenopathy CARDIAC: RRR, no murmurs, rubs, gallops RESPIRATORY:  Clear to auscultation without rales, wheezing or rhonchi  ABDOMEN: Soft, non-tender, non-distended MUSCULOSKELETAL:  No edema; No deformity  SKIN: Warm and dry NEUROLOGIC:  Alert and oriented x 3 PSYCHIATRIC:  Normal affect    Signed, Shirlee More, MD  10/01/2020 12:53 PM    [place]

## 2020-10-01 ENCOUNTER — Encounter: Payer: Self-pay | Admitting: Cardiology

## 2020-10-01 ENCOUNTER — Other Ambulatory Visit: Payer: Self-pay

## 2020-10-01 ENCOUNTER — Ambulatory Visit (INDEPENDENT_AMBULATORY_CARE_PROVIDER_SITE_OTHER): Payer: Medicare Other | Admitting: Cardiology

## 2020-10-01 VITALS — BP 124/60 | HR 80 | Ht 64.0 in | Wt 164.0 lb

## 2020-10-01 DIAGNOSIS — I25118 Atherosclerotic heart disease of native coronary artery with other forms of angina pectoris: Secondary | ICD-10-CM

## 2020-10-01 DIAGNOSIS — I10 Essential (primary) hypertension: Secondary | ICD-10-CM | POA: Diagnosis not present

## 2020-10-01 DIAGNOSIS — E782 Mixed hyperlipidemia: Secondary | ICD-10-CM | POA: Diagnosis not present

## 2020-10-01 NOTE — Patient Instructions (Signed)
Medication Instructions:  No medication changes. *If you need a refill on your cardiac medications before your next appointment, please call your pharmacy*   Lab Work: None ordered If you have labs (blood work) drawn today and your tests are completely normal, you will receive your results only by: . MyChart Message (if you have MyChart) OR . A paper copy in the mail If you have any lab test that is abnormal or we need to change your treatment, we will call you to review the results.   Testing/Procedures: None ordered   Follow-Up: At [facility], you and your health needs are our priority.  As part of our continuing mission to provide you with exceptional heart care, we have created designated Provider Care Teams.  These Care Teams include your primary Cardiologist (physician) and Advanced Practice Providers (APPs -  Physician Assistants and Nurse Practitioners) who all work together to provide you with the care you need, when you need it.  We recommend signing up for the patient portal called "MyChart".  Sign up information is provided on this After Visit Summary.  MyChart is used to connect with patients for Virtual Visits (Telemedicine).  Patients are able to view lab/test results, encounter notes, upcoming appointments, etc.  Non-urgent messages can be sent to your provider as well.   To learn more about what you can do with MyChart, go to [email or web address].    Your next appointment:   12 month(s)  The format for your next appointment:   In Person  Provider:   Brian Munley, MD   Other Instructions NA  

## 2021-01-13 DIAGNOSIS — D500 Iron deficiency anemia secondary to blood loss (chronic): Secondary | ICD-10-CM | POA: Diagnosis not present

## 2021-01-23 DIAGNOSIS — R195 Other fecal abnormalities: Secondary | ICD-10-CM | POA: Diagnosis not present

## 2021-01-23 DIAGNOSIS — D500 Iron deficiency anemia secondary to blood loss (chronic): Secondary | ICD-10-CM | POA: Diagnosis not present

## 2021-01-27 DIAGNOSIS — Z0000 Encounter for general adult medical examination without abnormal findings: Secondary | ICD-10-CM | POA: Diagnosis not present

## 2021-01-27 DIAGNOSIS — Z9181 History of falling: Secondary | ICD-10-CM | POA: Diagnosis not present

## 2021-01-27 DIAGNOSIS — Z1331 Encounter for screening for depression: Secondary | ICD-10-CM | POA: Diagnosis not present

## 2021-01-27 DIAGNOSIS — E785 Hyperlipidemia, unspecified: Secondary | ICD-10-CM | POA: Diagnosis not present

## 2021-01-27 DIAGNOSIS — Z139 Encounter for screening, unspecified: Secondary | ICD-10-CM | POA: Diagnosis not present

## 2021-02-17 DIAGNOSIS — D539 Nutritional anemia, unspecified: Secondary | ICD-10-CM | POA: Diagnosis not present

## 2021-02-17 DIAGNOSIS — M1990 Unspecified osteoarthritis, unspecified site: Secondary | ICD-10-CM | POA: Diagnosis not present

## 2021-02-17 DIAGNOSIS — I2510 Atherosclerotic heart disease of native coronary artery without angina pectoris: Secondary | ICD-10-CM | POA: Diagnosis not present

## 2021-02-17 DIAGNOSIS — E1165 Type 2 diabetes mellitus with hyperglycemia: Secondary | ICD-10-CM | POA: Diagnosis not present

## 2021-02-17 DIAGNOSIS — I10 Essential (primary) hypertension: Secondary | ICD-10-CM | POA: Diagnosis not present

## 2021-02-17 DIAGNOSIS — K219 Gastro-esophageal reflux disease without esophagitis: Secondary | ICD-10-CM | POA: Diagnosis not present

## 2021-02-17 DIAGNOSIS — I480 Paroxysmal atrial fibrillation: Secondary | ICD-10-CM | POA: Diagnosis not present

## 2021-02-17 DIAGNOSIS — Z6827 Body mass index (BMI) 27.0-27.9, adult: Secondary | ICD-10-CM | POA: Diagnosis not present

## 2021-02-17 DIAGNOSIS — E785 Hyperlipidemia, unspecified: Secondary | ICD-10-CM | POA: Diagnosis not present

## 2021-08-21 DIAGNOSIS — E785 Hyperlipidemia, unspecified: Secondary | ICD-10-CM | POA: Diagnosis not present

## 2021-08-21 DIAGNOSIS — K219 Gastro-esophageal reflux disease without esophagitis: Secondary | ICD-10-CM | POA: Diagnosis not present

## 2021-08-21 DIAGNOSIS — I480 Paroxysmal atrial fibrillation: Secondary | ICD-10-CM | POA: Diagnosis not present

## 2021-08-21 DIAGNOSIS — M1990 Unspecified osteoarthritis, unspecified site: Secondary | ICD-10-CM | POA: Diagnosis not present

## 2021-08-21 DIAGNOSIS — Z6827 Body mass index (BMI) 27.0-27.9, adult: Secondary | ICD-10-CM | POA: Diagnosis not present

## 2021-08-21 DIAGNOSIS — I2510 Atherosclerotic heart disease of native coronary artery without angina pectoris: Secondary | ICD-10-CM | POA: Diagnosis not present

## 2021-08-21 DIAGNOSIS — Z23 Encounter for immunization: Secondary | ICD-10-CM | POA: Diagnosis not present

## 2021-08-21 DIAGNOSIS — I10 Essential (primary) hypertension: Secondary | ICD-10-CM | POA: Diagnosis not present

## 2021-08-21 DIAGNOSIS — D539 Nutritional anemia, unspecified: Secondary | ICD-10-CM | POA: Diagnosis not present

## 2021-08-21 DIAGNOSIS — E1165 Type 2 diabetes mellitus with hyperglycemia: Secondary | ICD-10-CM | POA: Diagnosis not present

## 2021-09-02 DIAGNOSIS — D500 Iron deficiency anemia secondary to blood loss (chronic): Secondary | ICD-10-CM | POA: Diagnosis not present

## 2021-10-22 NOTE — Progress Notes (Signed)
Cardiology Office Note:    Date:  10/23/2021   ID:  Austin Duncan, DOB [birth year], MRN [id number]  PCP:  Lowella Dandy, NP  Cardiologist:  Shirlee More, MD    Referring MD: Lowella Dandy, NP    ASSESSMENT:    1. Coronary artery disease of native artery of native heart with stable angina pectoris ([place])   2. Hx of CABG   3. Mixed hyperlipidemia   4. Essential hypertension   5. Orthostatic hypertension   6. Carotid stenosis, bilateral    PLAN:    In order of problems listed above:  Austin Duncan continues to do well New York Heart Association class I 100 following coronary revascularization on current medical therapy continue aspirin beta-blocker and his high intensity statin.  At this time I would not advise an ischemic evaluation with chronic CAD Lipids are ideal continue with statin Well-controlled on ambulatory follow-up with his PCP continue current treatment including intermittent diuretic and beta-blocker. No recurrent symptomatic orthostatic hypotension Asymptomatic recheck carotid duplex if severe stenosis consider revascularization   Next appointment: 1 year   Medication Adjustments/Labs and Tests Ordered: Current medicines are reviewed at length with the patient today.  Concerns regarding medicines are outlined above.  No orders of the defined types were placed in this encounter.  No orders of the defined types were placed in this encounter.   Chief Complaint  Patient presents with   Annual Exam    History of Present Illness:    Austin Duncan is a 87 y.o. male with a hx of CAD and CABG December 2018 with a left thoracic artery anastomosis to the LAD vein graft to the posterior lateral branch of the right coronary artery and there is no graftable target of the left circumflex system he had severe ostial right coronary and left main coronary artery stenosis and symptomatic orthostatic hypotension last seen 03/28/2020.  He also has a history of carotid stenosis in December  2018 he had 40 to 59% stenosis right internal carotid artery and lower range 60 to 79% stenosis left internal carotid artery with bilateral antegrade flow in his carotids. He was last seen 10/01/2020.  Compliance with diet, lifestyle and medications: Yes  I have known and cared for we will Austin Duncan and his deceased wife since the late 1990s. He continues to live independently and has a good quality of life and following bypass and with medical therapy he has no angina edema shortness of breath palpitation or syncope. He has had no visual symptoms or signs of TIA He tolerates his statin without muscle pain or weakness recent labs 08/21/2021 shows LDL ideal 57 cholesterol 130 triglycerides 58 HDL 60.  Other labs were good with an A1c of 6.2% hemoglobin 15.4 and a creatinine of 0.89 Past Medical History:  Diagnosis Date   Carotid stenosis, bilateral    Coronary artery disease    History of amiodarone therapy    Hyperlipidemia    Hypertension    PAF (paroxysmal atrial fibrillation) (HCC)    Type 2 diabetes mellitus ([place])     Past Surgical History:  Procedure Laterality Date   CARDIAC CATHETERIZATION     CORONARY ARTERY BYPASS GRAFT     TOTAL KNEE ARTHROPLASTY Bilateral     Current Medications: Current Meds  Medication Sig   aspirin EC 81 MG tablet Take 81 mg by mouth daily. Swallow whole.   atorvastatin (LIPITOR) 20 MG tablet Take 20 mg by mouth daily.   furosemide (LASIX) 20 MG tablet Take  20 mg by mouth daily as needed for edema.   metoprolol tartrate (LOPRESSOR) 25 MG tablet TAKE 1/2 TABLET(12.5 MG) BY MOUTH TWICE DAILY   omeprazole (PRILOSEC) 20 MG capsule TAKE 1 CAPSULE BY MOUTH ONCE DAILY.     Allergies:   Patient has no known allergies.   Social History   Socioeconomic History   Marital status: Married    Spouse name: Not on file   Number of children: Not on file   Years of education: Not on file   Highest education level: Not on file  Occupational History   Not on  file  Tobacco Use   Smoking status: Never   Smokeless tobacco: Never  Vaping Use   Vaping Use: Not on file  Substance and Sexual Activity   Alcohol use: Never   Drug use: Never   Sexual activity: Not on file  Other Topics Concern   Not on file  Social History Narrative   Not on file   Social Determinants of Health   Financial Resource Strain: Not on file  Food Insecurity: Not on file  Transportation Needs: Not on file  Physical Activity: Not on file  Stress: Not on file  Social Connections: Not on file     Family History: The patient's family history includes Cancer in his mother; Heart attack in his father. ROS:   Please see the history of present illness.    All other systems reviewed and are negative.  EKGs/Labs/Other Studies Reviewed:    The following studies were reviewed today:  EKG:  EKG ordered today and personally reviewed.  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 nonspecific ST-T unchanged 10/01/2020    Physical Exam:    VS:  BP (!) 150/80 (BP Location: Right Arm, Patient Position: Sitting, Cuff Size: Normal)    Pulse 73    Ht 5\' 4"  (1.626 m)    Wt 161 lb (73 kg)    SpO2 95%    BMI 27.64 kg/m     Wt Readings from Last 3 Encounters:  10/23/21 161 lb (73 kg)  10/01/20 164 lb (74.4 kg)  03/28/20 158 lb 12.8 oz (72 kg)     GEN:  Well nourished, well developed in no acute distress he looks younger than his age 59: Normal NECK: No JVD; No carotid bruits LYMPHATICS: No lymphadenopathy CARDIAC: RRR, no murmurs, rubs, gallops RESPIRATORY:  Clear to auscultation without rales, wheezing or rhonchi  ABDOMEN: Soft, non-tender, non-distended MUSCULOSKELETAL:  No edema; No deformity  SKIN: Warm and dry NEUROLOGIC:  Alert and oriented x 3 PSYCHIATRIC:  Normal affect    Signed, Shirlee More, MD  10/23/2021 8:06 AM    [place]

## 2021-10-23 ENCOUNTER — Ambulatory Visit (INDEPENDENT_AMBULATORY_CARE_PROVIDER_SITE_OTHER): Payer: Medicare Other | Admitting: Cardiology

## 2021-10-23 ENCOUNTER — Other Ambulatory Visit: Payer: Self-pay

## 2021-10-23 ENCOUNTER — Encounter: Payer: Self-pay | Admitting: Cardiology

## 2021-10-23 VITALS — BP 150/80 | HR 73 | Ht 64.0 in | Wt 161.0 lb

## 2021-10-23 DIAGNOSIS — I6523 Occlusion and stenosis of bilateral carotid arteries: Secondary | ICD-10-CM

## 2021-10-23 DIAGNOSIS — I10 Essential (primary) hypertension: Secondary | ICD-10-CM | POA: Diagnosis not present

## 2021-10-23 DIAGNOSIS — E782 Mixed hyperlipidemia: Secondary | ICD-10-CM | POA: Diagnosis not present

## 2021-10-23 DIAGNOSIS — Z951 Presence of aortocoronary bypass graft: Secondary | ICD-10-CM | POA: Diagnosis not present

## 2021-10-23 DIAGNOSIS — I25118 Atherosclerotic heart disease of native coronary artery with other forms of angina pectoris: Secondary | ICD-10-CM

## 2021-10-23 NOTE — Patient Instructions (Signed)
Medication Instructions:  Your physician recommends that you continue on your current medications as directed. Please refer to the Current Medication list given to you today.  *If you need a refill on your cardiac medications before your next appointment, please call your pharmacy*   Lab Work: None If you have labs (blood work) drawn today and your tests are completely normal, you will receive your results only by: [place] (if you have MyChart) OR A paper copy in the mail If you have any lab test that is abnormal or we need to change your treatment, we will call you to review the results.   Testing/Procedures: Your physician has requested that you have a carotid duplex. This test is an ultrasound of the carotid arteries in your neck. It looks at blood flow through these arteries that supply the brain with blood. Allow one hour for this exam. There are no restrictions or special instructions.    Follow-Up: At [facility], you and your health needs are our priority.  As part of our continuing mission to provide you with exceptional heart care, we have created designated Provider Care Teams.  These Care Teams include your primary Cardiologist (physician) and Advanced Practice Providers (APPs -  Physician Assistants and Nurse Practitioners) who all work together to provide you with the care you need, when you need it.  We recommend signing up for the patient portal called "MyChart".  Sign up information is provided on this After Visit Summary.  MyChart is used to connect with patients for Virtual Visits (Telemedicine).  Patients are able to view lab/test results, encounter notes, upcoming appointments, etc.  Non-urgent messages can be sent to your provider as well.   To learn more about what you can do with MyChart, go to [email or web address].    Your next appointment:   1 year(s)  The format for your next appointment:   In Person  Provider:   Shirlee More, MD    Other  Instructions None

## 2021-10-29 ENCOUNTER — Other Ambulatory Visit: Payer: Self-pay

## 2021-10-29 ENCOUNTER — Ambulatory Visit (INDEPENDENT_AMBULATORY_CARE_PROVIDER_SITE_OTHER): Payer: Medicare Other

## 2021-10-29 DIAGNOSIS — E782 Mixed hyperlipidemia: Secondary | ICD-10-CM | POA: Diagnosis not present

## 2021-10-29 DIAGNOSIS — Z951 Presence of aortocoronary bypass graft: Secondary | ICD-10-CM | POA: Diagnosis not present

## 2021-10-29 DIAGNOSIS — I6523 Occlusion and stenosis of bilateral carotid arteries: Secondary | ICD-10-CM | POA: Diagnosis not present

## 2021-10-29 DIAGNOSIS — I25118 Atherosclerotic heart disease of native coronary artery with other forms of angina pectoris: Secondary | ICD-10-CM | POA: Diagnosis not present

## 2021-10-29 DIAGNOSIS — I10 Essential (primary) hypertension: Secondary | ICD-10-CM

## 2021-11-03 ENCOUNTER — Telehealth: Payer: Self-pay

## 2021-11-03 NOTE — Telephone Encounter (Signed)
Tried calling patient. No answer and no voicemail set up for me to leave a message. 

## 2021-11-03 NOTE — Telephone Encounter (Signed)
-----   Message from Richardo Priest, MD sent at 11/02/2021 11:19 AM EST ----- Good result stable no severe carotid narrowing.

## 2021-11-04 ENCOUNTER — Telehealth: Payer: Self-pay

## 2021-11-04 NOTE — Telephone Encounter (Signed)
Tried calling patient. No answer and no voicemail set up for me to leave a message. 

## 2021-11-04 NOTE — Telephone Encounter (Signed)
-----   Message from Richardo Priest, MD sent at 11/02/2021 11:19 AM EST ----- Good result stable no severe carotid narrowing.

## 2022-10-20 ENCOUNTER — Ambulatory Visit: Payer: Medicare Other | Admitting: Cardiology

## 2022-11-23 NOTE — Progress Notes (Signed)
Cardiology Office Note:    Date:  11/24/2022   ID:  Austin Duncan, DOB 11-04-1934, MRN [id number]  PCP:  Austin Party, NP  Cardiologist:  Austin Herrlich, MD    Referring MD: Austin Party, NP    ASSESSMENT:    1. Coronary artery disease of native artery of native heart with stable angina pectoris (HCC)   2. Hx of CABG   3. Essential hypertension   4. Orthostatic hypertension   5. Mixed hyperlipidemia   6. Carotid stenosis, bilateral    PLAN:    In order of problems listed above:  Austin Duncan continues to do well long-term with CAD having no anginal discomfort following CABG and on current medical therapy we will continue aspirin beta-blocker and high intensity statin Blood pressure well-controlled continue his beta-blocker has had no symptomatic orthostatic hypotension LDL is ideal and his current high intensity statin with CAD Minimal atherosclerosis no bruit no symptoms no reason to repeat imaging   Next appointment: 1 year   Medication Adjustments/Labs and Tests Ordered: Current medicines are reviewed at length with the patient today.  Concerns regarding medicines are outlined above.  No orders of the defined types were placed in this encounter.  No orders of the defined types were placed in this encounter.   Chief Complaint  Patient presents with   Annual Exam   Follow-up   Coronary Artery Disease    History of Present Illness:    Austin Duncan is a 88 y.o. male with a hx of coronary artery disease with CABG December 2018 with left thoracic artery to the LAD vein graft to posterior lateral branch of the right coronary artery and had no graftable target left circumflex coronary system hyperlipidemia hypertension orthostatic hypotension and carotid stenosis last seen 10/23/2021.  Compliance with diet, lifestyle and medications: Yes  Goal remains very vigorous and active is having no cardiovascular symptoms of edema shortness of breath chest pain palpitation or syncope He  takes oral iron for anemia but his most recent hemoglobin was 14.7 creatinine 0.84 potassium 4.3 cholesterol 132 LDL 54 HDL 66 triglycerides 54 A1c 6.0% 10/22/2022  Following his visit he had a carotid duplex performed 10/29/2021 showing bilateral 1 to 39% ICA stenosis and less than 50% stenosis noted in the right common carotid artery.   Past Medical History:  Diagnosis Date   Carotid stenosis, bilateral    Coronary artery disease    History of amiodarone therapy    Hyperlipidemia    Hypertension    PAF (paroxysmal atrial fibrillation) (HCC)    Type 2 diabetes mellitus (HCC)     Past Surgical History:  Procedure Laterality Date   CARDIAC CATHETERIZATION     CORONARY ARTERY BYPASS GRAFT     TOTAL KNEE ARTHROPLASTY Bilateral     Current Medications: Current Meds  Medication Sig   aspirin EC 81 MG tablet Take 81 mg by mouth daily. Swallow whole.   atorvastatin (LIPITOR) 20 MG tablet Take 20 mg by mouth daily.   ferrous sulfate 324 (65 Fe) MG TBEC Take 1 tablet by mouth daily.   furosemide (LASIX) 20 MG tablet Take 20 mg by mouth daily as needed for edema.   metoprolol tartrate (LOPRESSOR) 25 MG tablet TAKE 1/2 TABLET(12.5 MG) BY MOUTH TWICE DAILY   omeprazole (PRILOSEC) 20 MG capsule TAKE 1 CAPSULE BY MOUTH ONCE DAILY.     Allergies:   Patient has no known allergies.   Social History   Socioeconomic History   Marital status:  Married    Spouse name: Not on file   Number of children: Not on file   Years of education: Not on file   Highest education level: Not on file  Occupational History   Not on file  Tobacco Use   Smoking status: Never   Smokeless tobacco: Never  Vaping Use   Vaping Use: Not on file  Substance and Sexual Activity   Alcohol use: Never   Drug use: Never   Sexual activity: Not on file  Other Topics Concern   Not on file  Social History Narrative   Not on file   Social Determinants of Health   Financial Resource Strain: Not on file  Food  Insecurity: Not on file  Transportation Needs: Not on file  Physical Activity: Not on file  Stress: Not on file  Social Connections: Not on file     Family History: The patient's family history includes Cancer in his mother; Heart attack in his father. ROS:   Please see the history of present illness.    All other systems reviewed and are negative.  EKGs/Labs/Other Studies Reviewed:    The following studies were reviewed today:  EKG:  EKG ordered today and personally reviewed.  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 nonspecific T waves  Recent Labs: See history Physical Exam:    VS:  BP 130/70 (BP Location: Right Arm, Patient Position: Sitting, Cuff Size: Normal)   Pulse 72   Ht 5\' 4"  (1.626 m)   Wt 151 lb (68.5 kg)   SpO2 96%   BMI 25.92 kg/m     Wt Readings from Last 3 Encounters:  11/24/22 151 lb (68.5 kg)  10/23/21 161 lb (73 kg)  10/01/20 164 lb (74.4 kg)     GEN: Looks younger than his age well nourished, well developed in no acute distress HEENT: Normal NECK: No JVD; No carotid bruits LYMPHATICS: No lymphadenopathy CARDIAC: RRR, no murmurs, rubs, gallops RESPIRATORY:  Clear to auscultation without rales, wheezing or rhonchi  ABDOMEN: Soft, non-tender, non-distended MUSCULOSKELETAL:  No edema; No deformity  SKIN: Warm and dry NEUROLOGIC:  Alert and oriented x 3 PSYCHIATRIC:  Normal affect   See Debroah Loop, CMA chaperone   Signed, Austin Herrlich, MD  11/24/2022 3:10 PM    [facility]

## 2022-11-24 ENCOUNTER — Ambulatory Visit: Payer: Medicare Other | Attending: Cardiology | Admitting: Cardiology

## 2022-11-24 ENCOUNTER — Encounter: Payer: Self-pay | Admitting: Cardiology

## 2022-11-24 VITALS — BP 130/70 | HR 72 | Ht 64.0 in | Wt 151.0 lb

## 2022-11-24 DIAGNOSIS — I25118 Atherosclerotic heart disease of native coronary artery with other forms of angina pectoris: Secondary | ICD-10-CM | POA: Diagnosis not present

## 2022-11-24 DIAGNOSIS — I10 Essential (primary) hypertension: Secondary | ICD-10-CM | POA: Diagnosis not present

## 2022-11-24 DIAGNOSIS — Z7409 Other reduced mobility: Secondary | ICD-10-CM

## 2022-11-24 DIAGNOSIS — Z951 Presence of aortocoronary bypass graft: Secondary | ICD-10-CM

## 2022-11-24 DIAGNOSIS — E782 Mixed hyperlipidemia: Secondary | ICD-10-CM

## 2022-11-24 DIAGNOSIS — I6523 Occlusion and stenosis of bilateral carotid arteries: Secondary | ICD-10-CM

## 2022-11-24 HISTORY — DX: Other reduced mobility: Z74.09

## 2022-11-24 NOTE — Patient Instructions (Signed)
Medication Instructions:  Your physician recommends that you continue on your current medications as directed. Please refer to the Current Medication list given to you today.  *If you need a refill on your cardiac medications before your next appointment, please call your pharmacy*   Lab Work: None If you have labs (blood work) drawn today and your tests are completely normal, you will receive your results only by: [place] (if you have MyChart) OR A paper copy in the mail If you have any lab test that is abnormal or we need to change your treatment, we will call you to review the results.   Testing/Procedures: None   Follow-Up: At [facility], you and your health needs are our priority.  As part of our continuing mission to provide you with exceptional heart care, we have created designated Provider Care Teams.  These Care Teams include your primary Cardiologist (physician) and Advanced Practice Providers (APPs -  Physician Assistants and Nurse Practitioners) who all work together to provide you with the care you need, when you need it.  We recommend signing up for the patient portal called "MyChart".  Sign up information is provided on this After Visit Summary.  MyChart is used to connect with patients for Virtual Visits (Telemedicine).  Patients are able to view lab/test results, encounter notes, upcoming appointments, etc.  Non-urgent messages can be sent to your provider as well.   To learn more about what you can do with MyChart, go to [email or web address].    Your next appointment:   1 year(s)  Provider:   Shirlee More, MD    Other Instructions None

## 2023-12-13 ENCOUNTER — Encounter: Payer: Self-pay | Admitting: Cardiology

## 2023-12-13 NOTE — Progress Notes (Unsigned)
  Cardiology Office Note:    Date:  12/13/2023   ID:  Austin Duncan, DOB 1934-10-16, MRN [id number]  PCP:  Hurshel Party, NP  Cardiologist:  Norman Herrlich, MD    Referring MD: Hurshel Party, NP    ASSESSMENT:    1. Coronary artery disease of native artery of native heart with stable angina pectoris (HCC)   2. Hx of CABG   3. Essential hypertension   4. Mixed hyperlipidemia   5. Orthostatic hypertension   6. Carotid stenosis, bilateral    PLAN:    In order of problems listed above:  ***   Next appointment: ***   Medication Adjustments/Labs and Tests Ordered: Current medicines are reviewed at length with the patient today.  Concerns regarding medicines are outlined above.  No orders of the defined types were placed in this encounter.  No orders of the defined types were placed in this encounter.    History of Present Illness:    Austin Duncan is a 89 y.o. male with a hx of CAD with remote CABG December 2018 hyperlipidemia hypertension carotid stenosis and orthostatic hypotension.  Last seen 11/24/2022.  His carotid duplex January 2023 showed bilateral 1 to 39% ICA stenosis. Compliance with diet, lifestyle and medications: *** Past Medical History:  Diagnosis Date   Carotid stenosis, bilateral    Chest pain 09/08/2017   Coronary artery disease    History of amiodarone therapy    Hx of CABG 10/15/2017   Hyperlipidemia    Hypertension    Other reduced mobility 11/24/2022   PAF (paroxysmal atrial fibrillation) (HCC)    Shortness of breath 09/08/2017   Type 2 diabetes mellitus (HCC)     Current Medications: No outpatient medications have been marked as taking for the 12/14/23 encounter (Appointment) with Baldo Daub, MD.      EKGs/Labs/Other Studies Reviewed:    The following studies were reviewed today:          Recent Labs: No results found for requested labs within last 365 days.  Recent Lipid Panel No results found for: "CHOL", "TRIG", "HDL", "CHOLHDL",  "VLDL", "LDLCALC", "LDLDIRECT"  Physical Exam:    VS:  There were no vitals taken for this visit.    Wt Readings from Last 3 Encounters:  11/24/22 151 lb (68.5 kg)  10/23/21 161 lb (73 kg)  10/01/20 164 lb (74.4 kg)     GEN: *** Well nourished, well developed in no acute distress HEENT: Normal NECK: No JVD; No carotid bruits LYMPHATICS: No lymphadenopathy CARDIAC: ***RRR, no murmurs, rubs, gallops RESPIRATORY:  Clear to auscultation without rales, wheezing or rhonchi  ABDOMEN: Soft, non-tender, non-distended MUSCULOSKELETAL:  No edema; No deformity  SKIN: Warm and dry NEUROLOGIC:  Alert and oriented x 3 PSYCHIATRIC:  Normal affect    Signed, Norman Herrlich, MD  12/13/2023 6:25 PM    [facility]

## 2023-12-14 ENCOUNTER — Ambulatory Visit: Payer: Medicare Other | Attending: Cardiology | Admitting: Cardiology

## 2023-12-14 ENCOUNTER — Encounter: Payer: Self-pay | Admitting: Cardiology

## 2023-12-14 VITALS — BP 120/72 | HR 78 | Ht 64.0 in | Wt 153.4 lb

## 2023-12-14 DIAGNOSIS — E782 Mixed hyperlipidemia: Secondary | ICD-10-CM

## 2023-12-14 DIAGNOSIS — I6523 Occlusion and stenosis of bilateral carotid arteries: Secondary | ICD-10-CM

## 2023-12-14 DIAGNOSIS — I10 Essential (primary) hypertension: Secondary | ICD-10-CM | POA: Diagnosis not present

## 2023-12-14 DIAGNOSIS — Z951 Presence of aortocoronary bypass graft: Secondary | ICD-10-CM

## 2023-12-14 DIAGNOSIS — I25118 Atherosclerotic heart disease of native coronary artery with other forms of angina pectoris: Secondary | ICD-10-CM | POA: Diagnosis not present

## 2023-12-14 DIAGNOSIS — I951 Orthostatic hypotension: Secondary | ICD-10-CM

## 2023-12-14 NOTE — Patient Instructions (Signed)

## 2024-12-12 ENCOUNTER — Ambulatory Visit: Admitting: Cardiology
